# Patient Record
Sex: Male | Born: 1950
Health system: Southern US, Community
[De-identification: ages and names within clinical notes are randomized; demographics above are authoritative.]

## PROBLEM LIST (undated history)

## (undated) DIAGNOSIS — I10 Essential (primary) hypertension: Secondary | ICD-10-CM

## (undated) DIAGNOSIS — K219 Gastro-esophageal reflux disease without esophagitis: Secondary | ICD-10-CM

## (undated) HISTORY — DX: Gastro-esophageal reflux disease without esophagitis: K21.9

## (undated) HISTORY — DX: Essential (primary) hypertension: I10

## (undated) HISTORY — PX: COLONOSCOPY W/ POLYPECTOMY: SHX1380

---

## 1989-08-15 HISTORY — PX: VASECTOMY: SHX75

## 2007-06-28 ENCOUNTER — Ambulatory Visit: Payer: Self-pay | Admitting: Internal Medicine

## 2007-09-24 ENCOUNTER — Ambulatory Visit: Payer: Self-pay | Admitting: Gastroenterology

## 2007-11-14 ENCOUNTER — Ambulatory Visit: Payer: Self-pay | Admitting: Internal Medicine

## 2007-11-27 ENCOUNTER — Ambulatory Visit: Payer: Self-pay | Admitting: Internal Medicine

## 2007-12-14 ENCOUNTER — Ambulatory Visit: Payer: Self-pay | Admitting: Internal Medicine

## 2008-01-14 ENCOUNTER — Ambulatory Visit: Payer: Self-pay | Admitting: Internal Medicine

## 2008-01-29 ENCOUNTER — Ambulatory Visit: Payer: Self-pay | Admitting: Internal Medicine

## 2008-02-13 ENCOUNTER — Ambulatory Visit: Payer: Self-pay | Admitting: Internal Medicine

## 2010-05-09 ENCOUNTER — Emergency Department: Payer: Self-pay | Admitting: Emergency Medicine

## 2010-05-18 ENCOUNTER — Ambulatory Visit: Payer: Self-pay | Admitting: Internal Medicine

## 2010-09-20 ENCOUNTER — Ambulatory Visit: Payer: Self-pay | Admitting: Gastroenterology

## 2010-09-22 LAB — PATHOLOGY REPORT

## 2012-02-22 ENCOUNTER — Ambulatory Visit: Payer: Self-pay

## 2012-05-01 ENCOUNTER — Ambulatory Visit: Payer: Self-pay | Admitting: Gastroenterology

## 2012-10-01 ENCOUNTER — Ambulatory Visit: Payer: Self-pay | Admitting: Gastroenterology

## 2012-10-02 LAB — PATHOLOGY REPORT

## 2013-06-12 ENCOUNTER — Emergency Department: Payer: Self-pay | Admitting: Internal Medicine

## 2013-06-21 ENCOUNTER — Emergency Department: Payer: Self-pay | Admitting: Emergency Medicine

## 2016-04-29 ENCOUNTER — Ambulatory Visit
Admission: RE | Admit: 2016-04-29 | Discharge: 2016-04-29 | Disposition: A | Discharge status: Home / Self Care | Payer: 59 | Source: Ambulatory Visit | Attending: Internal Medicine | Admitting: Internal Medicine

## 2016-04-29 ENCOUNTER — Other Ambulatory Visit: Payer: Self-pay | Admitting: Internal Medicine

## 2016-04-29 DIAGNOSIS — R0602 Shortness of breath: Secondary | ICD-10-CM

## 2017-08-22 ENCOUNTER — Other Ambulatory Visit: Payer: Self-pay

## 2017-08-22 MED ORDER — PANTOPRAZOLE SODIUM 40 MG PO TBEC: 40.0000 mg | DELAYED_RELEASE_TABLET | Freq: Every day | ORAL | 1 refills | Status: DC

## 2017-11-06 ENCOUNTER — Other Ambulatory Visit: Payer: Self-pay | Admitting: Internal Medicine

## 2017-11-06 DIAGNOSIS — E782 Mixed hyperlipidemia: Secondary | ICD-10-CM | POA: Diagnosis not present

## 2017-11-06 DIAGNOSIS — I10 Essential (primary) hypertension: Secondary | ICD-10-CM | POA: Diagnosis not present

## 2017-11-06 DIAGNOSIS — Z125 Encounter for screening for malignant neoplasm of prostate: Secondary | ICD-10-CM | POA: Diagnosis not present

## 2017-11-06 DIAGNOSIS — Z0001 Encounter for general adult medical examination with abnormal findings: Secondary | ICD-10-CM | POA: Diagnosis not present

## 2017-11-07 LAB — COMPREHENSIVE METABOLIC PANEL
A/G RATIO: 1.8 (ref 1.2–2.2)
ALK PHOS: 55 IU/L (ref 39–117)
ALT: 24 IU/L (ref 0–44)
AST: 20 IU/L (ref 0–40)
Albumin: 4.4 g/dL (ref 3.6–4.8)
BILIRUBIN TOTAL: 0.6 mg/dL (ref 0.0–1.2)
BUN/Creatinine Ratio: 11 (ref 10–24)
BUN: 14 mg/dL (ref 8–27)
CALCIUM: 9.3 mg/dL (ref 8.6–10.2)
CHLORIDE: 102 mmol/L (ref 96–106)
CO2: 20 mmol/L (ref 20–29)
Creatinine, Ser: 1.3 mg/dL — ABNORMAL HIGH (ref 0.76–1.27)
GFR calc Af Amer: 66 mL/min/{1.73_m2} (ref >59)
GFR calc non Af Amer: 57 mL/min/{1.73_m2} — ABNORMAL LOW (ref >59)
Globulin, Total: 2.4 g/dL (ref 1.5–4.5)
Glucose: 96 mg/dL (ref 65–99)
POTASSIUM: 4.9 mmol/L (ref 3.5–5.2)
SODIUM: 142 mmol/L (ref 134–144)
Total Protein: 6.8 g/dL (ref 6.0–8.5)

## 2017-11-07 LAB — CBC
HEMOGLOBIN: 14.8 g/dL (ref 13.0–17.7)
Hematocrit: 43.9 % (ref 37.5–51.0)
MCH: 31.7 pg (ref 26.6–33.0)
MCHC: 33.7 g/dL (ref 31.5–35.7)
MCV: 94 fL (ref 79–97)
PLATELETS: 306 10*3/uL (ref 150–379)
RBC: 4.67 x10E6/uL (ref 4.14–5.80)
RDW: 13.7 % (ref 12.3–15.4)
WBC: 11.6 10*3/uL — AB (ref 3.4–10.8)

## 2017-11-07 LAB — LIPID PANEL W/O CHOL/HDL RATIO
CHOLESTEROL TOTAL: 223 mg/dL — AB (ref 100–199)
HDL: 49 mg/dL (ref >39)
LDL CALC: 155 mg/dL — AB (ref 0–99)
TRIGLYCERIDES: 97 mg/dL (ref 0–149)
VLDL CHOLESTEROL CAL: 19 mg/dL (ref 5–40)

## 2017-11-07 LAB — T4, FREE: Free T4: 1.2 ng/dL (ref 0.82–1.77)

## 2017-11-07 LAB — TSH: TSH: 2.18 u[IU]/mL (ref 0.450–4.500)

## 2017-11-07 LAB — PSA: PROSTATE SPECIFIC AG, SERUM: 0.5 ng/mL (ref 0.0–4.0)

## 2017-12-12 ENCOUNTER — Encounter: Payer: Self-pay | Admitting: Nurse Practitioner

## 2017-12-12 ENCOUNTER — Ambulatory Visit: Payer: Self-pay | Admitting: Nurse Practitioner

## 2017-12-12 ENCOUNTER — Ambulatory Visit: Payer: Medicare Other | Admitting: Nurse Practitioner

## 2017-12-12 VITALS — BP 134/76 | HR 64 | Resp 16 | Ht 68.0 in | Wt 171.6 lb

## 2017-12-12 DIAGNOSIS — I10 Essential (primary) hypertension: Secondary | ICD-10-CM

## 2017-12-12 DIAGNOSIS — K219 Gastro-esophageal reflux disease without esophagitis: Secondary | ICD-10-CM | POA: Diagnosis not present

## 2017-12-12 DIAGNOSIS — F17210 Nicotine dependence, cigarettes, uncomplicated: Secondary | ICD-10-CM

## 2017-12-12 DIAGNOSIS — D72829 Elevated white blood cell count, unspecified: Secondary | ICD-10-CM

## 2017-12-12 MED ORDER — AMLODIPINE BESYLATE 2.5 MG PO TABS: 2.5000 mg | ORAL_TABLET | Freq: Every day | ORAL | 5 refills | Status: DC

## 2017-12-12 MED ORDER — PANTOPRAZOLE SODIUM 40 MG PO TBEC
40.0000 mg | DELAYED_RELEASE_TABLET | Freq: Every day | ORAL | 5 refills | Status: DC
Start: 2017-12-12 — End: 2018-06-26

## 2017-12-12 NOTE — Progress Notes (Signed)
[facility] [place], [place] [address]  Internal MEDICINE  Office Visit Note  Patient Name: Adam Hurst  [id number]  [id number]  Date of Service: 01/03/2018   Pt is here for routine follow up.   Chief Complaint  Patient presents with  . Hypertension    Hypertension  This is a chronic problem. The current episode started more than 1 year ago. The problem is unchanged. The problem is controlled. Associated symptoms include malaise/fatigue and palpitations. Pertinent negatives include no chest pain, headaches, neck pain or shortness of breath. Risk factors for coronary artery disease include smoking/tobacco exposure and male gender. Past treatments include calcium channel blockers. The current treatment provides moderate improvement. There are no compliance problems.        Current Medication: Outpatient Encounter Medications as of 12/12/2017  Medication Sig  . aspirin EC 81 MG tablet Take 81 mg by mouth daily.  . pantoprazole (PROTONIX) 40 MG tablet Take 1 tablet (40 mg total) by mouth daily.  . [DISCONTINUED] pantoprazole (PROTONIX) 40 MG tablet Take 1 tablet (40 mg total) by mouth daily.  Marland Kitchen amLODipine (NORVASC) 2.5 MG tablet Take 1 tablet (2.5 mg total) by mouth daily.  . [DISCONTINUED] amLODipine (NORVASC) 2.5 MG tablet TAKE 1 TABLET BY MOUTH EVERY DAY FOR BLOOD PRESSURE   No facility-administered encounter medications on file as of 12/12/2017.     Surgical History: Past Surgical History:  Procedure Laterality Date  . VASECTOMY Bilateral 1991    Medical History: Past Medical History:  Diagnosis Date  . GERD (gastroesophageal reflux disease)   . Hypertension     Family History: Family History  Problem Relation Age of Onset  . Alzheimer's disease Mother   . Cancer Father     Social History   Socioeconomic History  . Marital status: Married    Spouse name: Not on file  . Number of children: Not on file  . Years of education: Not  on file  . Highest education level: Not on file  Occupational History  . Not on file  Social Needs  . Financial resource strain: Not on file  . Food insecurity:    Worry: Not on file    Inability: Not on file  . Transportation needs:    Medical: Not on file    Non-medical: Not on file  Tobacco Use  . Smoking status: Current Every Day Smoker    Packs/day: 1.00    Types: Cigarettes  . Smokeless tobacco: Never Used  Substance and Sexual Activity  . Alcohol use: Yes    Comment: rare  . Drug use: Never  . Sexual activity: Not on file  Lifestyle  . Physical activity:    Days per week: Not on file    Minutes per session: Not on file  . Stress: Not on file  Relationships  . Social connections:    Talks on phone: Not on file    Gets together: Not on file    Attends religious service: Not on file    Active member of club or organization: Not on file    Attends meetings of clubs or organizations: Not on file    Relationship status: Not on file  . Intimate partner violence:    Fear of current or ex partner: Not on file    Emotionally abused: Not on file    Physically abused: Not on file    Forced sexual activity: Not on file  Other Topics Concern  . Not on  file  Social History Narrative  . Not on file      Review of Systems  Constitutional: Positive for malaise/fatigue. Negative for activity change, chills, fatigue and unexpected weight change.  HENT: Negative for congestion, postnasal drip, rhinorrhea, sneezing, sore throat and voice change.   Eyes: Negative.  Negative for redness.  Respiratory: Negative for cough, chest tightness, shortness of breath and wheezing.   Cardiovascular: Positive for palpitations. Negative for chest pain.  Gastrointestinal: Negative for abdominal pain, constipation, diarrhea, nausea and vomiting.  Endocrine: Negative for cold intolerance, heat intolerance, polydipsia, polyphagia and polyuria.  Genitourinary: Negative for dysuria and frequency.   Musculoskeletal: Negative for arthralgias, back pain, joint swelling and neck pain.  Skin: Negative for rash.  Allergic/Immunologic: Positive for environmental allergies.  Neurological: Negative for dizziness, tremors, numbness and headaches.  Hematological: Negative for adenopathy. Does not bruise/bleed easily.  Psychiatric/Behavioral: Negative for behavioral problems (Depression), dysphoric mood, sleep disturbance and suicidal ideas. The patient is not nervous/anxious.     Today's Vitals   12/12/17 1413  BP: 134/76  Pulse: 64  Resp: 16  SpO2: 98%  Weight: 171 lb 9.6 oz (77.8 kg)  Height: 5\' 8"  (1.727 m)    Physical Exam  Constitutional: He is oriented to person, place, and time. He appears well-developed and well-nourished. No distress.  HENT:  Head: Normocephalic and atraumatic.  Nose: Nose normal.  Mouth/Throat: Oropharynx is clear and moist. No oropharyngeal exudate.  Eyes: Pupils are equal, round, and reactive to light. Conjunctivae and EOM are normal.  Neck: Normal range of motion. Neck supple. No JVD present. Carotid bruit is not present. No tracheal deviation present. No thyromegaly present.  Cardiovascular: Normal rate, regular rhythm and normal heart sounds. Exam reveals no gallop and no friction rub.  No murmur heard. Pulmonary/Chest: Effort normal and breath sounds normal. No respiratory distress. He has no wheezes. He has no rales. He exhibits no tenderness.  Abdominal: Soft. Bowel sounds are normal.  Musculoskeletal: Normal range of motion.  Lymphadenopathy:    He has no cervical adenopathy.  Neurological: He is alert and oriented to person, place, and time. No cranial nerve deficit.  Skin: Skin is warm and dry. Capillary refill takes 2 to 3 seconds. He is not diaphoretic.  Psychiatric: He has a normal mood and affect. His behavior is normal. Judgment and thought content normal.  Nursing note and vitals reviewed.  Assessment/Plan: 1. Essential  hypertension Well controlled. Continue norvasc as prescribed  - amLODipine (NORVASC) 2.5 MG tablet; Take 1 tablet (2.5 mg total) by mouth daily.  Dispense: 30 tablet; Refill: 5  2. Gastroesophageal reflux disease without esophagitis Continue pantoprazole as prescribed.  - pantoprazole (PROTONIX) 40 MG tablet; Take 1 tablet (40 mg total) by mouth daily.  Dispense: 30 tablet; Refill: 5  3. Leukocytosis, unspecified type Recheck CBC and treat as indicated.  - CBC  4. Cigarette smoker Encouraged smoking cessation.    General Counseling: Acelin verbalizes understanding of the findings of todays visit and agrees with plan of treatment. I have discussed any further diagnostic evaluation that may be needed or ordered today. We also reviewed his medications today. he has been encouraged to call the office with any questions or concerns that should arise related to todays visit.   Hypertension Counseling:   The following hypertensive lifestyle modification were recommended and discussed:  1. Limiting alcohol intake to less than 1 oz/day of ethanol:(24 oz of beer or 8 oz of wine or 2 oz of 100-proof whiskey). 2.  Take baby ASA 81 mg daily. 3. Importance of regular aerobic exercise and losing weight. 4. Reduce dietary saturated fat and cholesterol intake for overall cardiovascular health. 5. Maintaining adequate dietary potassium, calcium, and magnesium intake. 6. Regular monitoring of the blood pressure. 7. Reduce sodium intake to less than 100 mmol/day (less than 2.3 gm of sodium or less than 6 gm of sodium choride)   This patient was seen by Leretha Pol, FNP- C in Collaboration with Dr Lavera Guise as a part of collaborative care agreement    Orders Placed This Encounter  Procedures  . CBC    Meds ordered this encounter  Medications  . amLODipine (NORVASC) 2.5 MG tablet    Sig: Take 1 tablet (2.5 mg total) by mouth daily.    Dispense:  30 tablet    Refill:  5    Order Specific  Question:   Supervising Provider    Answer:   Lavera Guise [[id number]]  . pantoprazole (PROTONIX) 40 MG tablet    Sig: Take 1 tablet (40 mg total) by mouth daily.    Dispense:  30 tablet    Refill:  5    Order Specific Question:   Supervising Provider    Answer:   Lavera Guise [[id number]]    Time spent: 84 Minutes        Dr Lavera Guise Internal medicine

## 2018-01-03 ENCOUNTER — Encounter: Payer: Self-pay | Admitting: Nurse Practitioner

## 2018-01-03 DIAGNOSIS — F17210 Nicotine dependence, cigarettes, uncomplicated: Secondary | ICD-10-CM | POA: Insufficient documentation

## 2018-01-03 DIAGNOSIS — I10 Essential (primary) hypertension: Secondary | ICD-10-CM | POA: Insufficient documentation

## 2018-01-03 DIAGNOSIS — K219 Gastro-esophageal reflux disease without esophagitis: Secondary | ICD-10-CM | POA: Insufficient documentation

## 2018-01-03 DIAGNOSIS — D72829 Elevated white blood cell count, unspecified: Secondary | ICD-10-CM | POA: Insufficient documentation

## 2018-01-17 ENCOUNTER — Other Ambulatory Visit: Payer: Self-pay | Admitting: Nurse Practitioner

## 2018-01-17 DIAGNOSIS — Z125 Encounter for screening for malignant neoplasm of prostate: Secondary | ICD-10-CM | POA: Diagnosis not present

## 2018-01-17 DIAGNOSIS — E782 Mixed hyperlipidemia: Secondary | ICD-10-CM | POA: Diagnosis not present

## 2018-01-17 DIAGNOSIS — I10 Essential (primary) hypertension: Secondary | ICD-10-CM | POA: Diagnosis not present

## 2018-01-17 DIAGNOSIS — Z0001 Encounter for general adult medical examination with abnormal findings: Secondary | ICD-10-CM | POA: Diagnosis not present

## 2018-01-18 LAB — TSH: TSH: 2.67 u[IU]/mL (ref 0.450–4.500)

## 2018-01-18 LAB — LIPID PANEL W/O CHOL/HDL RATIO
Cholesterol, Total: 184 mg/dL (ref 100–199)
HDL: 44 mg/dL (ref >39)
LDL Calculated: 120 mg/dL — ABNORMAL HIGH (ref 0–99)
TRIGLYCERIDES: 102 mg/dL (ref 0–149)
VLDL Cholesterol Cal: 20 mg/dL (ref 5–40)

## 2018-01-18 LAB — CBC
HEMATOCRIT: 42.5 % (ref 37.5–51.0)
HEMOGLOBIN: 14.1 g/dL (ref 13.0–17.7)
MCH: 31.7 pg (ref 26.6–33.0)
MCHC: 33.2 g/dL (ref 31.5–35.7)
MCV: 96 fL (ref 79–97)
Platelets: 324 10*3/uL (ref 150–450)
RBC: 4.45 x10E6/uL (ref 4.14–5.80)
RDW: 13 % (ref 12.3–15.4)
WBC: 11.9 10*3/uL — ABNORMAL HIGH (ref 3.4–10.8)

## 2018-01-18 LAB — COMPREHENSIVE METABOLIC PANEL
ALT: 13 IU/L (ref 0–44)
AST: 14 IU/L (ref 0–40)
Albumin/Globulin Ratio: 2 (ref 1.2–2.2)
Albumin: 4.1 g/dL (ref 3.6–4.8)
Alkaline Phosphatase: 59 IU/L (ref 39–117)
BILIRUBIN TOTAL: 1.1 mg/dL (ref 0.0–1.2)
BUN / CREAT RATIO: 9 — AB (ref 10–24)
BUN: 12 mg/dL (ref 8–27)
CHLORIDE: 103 mmol/L (ref 96–106)
CO2: 23 mmol/L (ref 20–29)
CREATININE: 1.34 mg/dL — AB (ref 0.76–1.27)
Calcium: 8.9 mg/dL (ref 8.6–10.2)
GFR calc Af Amer: 64 mL/min/{1.73_m2} (ref >59)
GFR calc non Af Amer: 55 mL/min/{1.73_m2} — ABNORMAL LOW (ref >59)
GLUCOSE: 72 mg/dL (ref 65–99)
Globulin, Total: 2.1 g/dL (ref 1.5–4.5)
Potassium: 4.5 mmol/L (ref 3.5–5.2)
Sodium: 140 mmol/L (ref 134–144)
Total Protein: 6.2 g/dL (ref 6.0–8.5)

## 2018-01-18 LAB — PSA: Prostate Specific Ag, Serum: 0.6 ng/mL (ref 0.0–4.0)

## 2018-05-30 DIAGNOSIS — Z23 Encounter for immunization: Secondary | ICD-10-CM | POA: Diagnosis not present

## 2018-06-26 ENCOUNTER — Encounter: Payer: Self-pay | Admitting: Nurse Practitioner

## 2018-06-26 ENCOUNTER — Ambulatory Visit (INDEPENDENT_AMBULATORY_CARE_PROVIDER_SITE_OTHER): Payer: Medicare Other | Admitting: Nurse Practitioner

## 2018-06-26 VITALS — BP 151/89 | HR 62 | Resp 16 | Ht 68.0 in | Wt 173.8 lb

## 2018-06-26 DIAGNOSIS — K219 Gastro-esophageal reflux disease without esophagitis: Secondary | ICD-10-CM

## 2018-06-26 DIAGNOSIS — Z1211 Encounter for screening for malignant neoplasm of colon: Secondary | ICD-10-CM | POA: Insufficient documentation

## 2018-06-26 DIAGNOSIS — N289 Disorder of kidney and ureter, unspecified: Secondary | ICD-10-CM | POA: Diagnosis not present

## 2018-06-26 DIAGNOSIS — D485 Neoplasm of uncertain behavior of skin: Secondary | ICD-10-CM | POA: Insufficient documentation

## 2018-06-26 DIAGNOSIS — Z0001 Encounter for general adult medical examination with abnormal findings: Secondary | ICD-10-CM | POA: Diagnosis not present

## 2018-06-26 DIAGNOSIS — Z23 Encounter for immunization: Secondary | ICD-10-CM

## 2018-06-26 DIAGNOSIS — I10 Essential (primary) hypertension: Secondary | ICD-10-CM

## 2018-06-26 DIAGNOSIS — D72829 Elevated white blood cell count, unspecified: Secondary | ICD-10-CM

## 2018-06-26 DIAGNOSIS — F17210 Nicotine dependence, cigarettes, uncomplicated: Secondary | ICD-10-CM | POA: Diagnosis not present

## 2018-06-26 MED ORDER — PANTOPRAZOLE SODIUM 40 MG PO TBEC: 40.0000 mg | DELAYED_RELEASE_TABLET | Freq: Every day | ORAL | 5 refills | Status: DC

## 2018-06-26 MED ORDER — AMLODIPINE BESYLATE 2.5 MG PO TABS: 2.5000 mg | ORAL_TABLET | Freq: Every day | ORAL | 5 refills | Status: DC

## 2018-06-26 NOTE — Progress Notes (Signed)
[facility] [place], [place] [address]  Internal MEDICINE  Office Visit Note  Patient Name: Adam Hurst  [id number]  [id number]  Date of Service: 06/26/2018  Chief Complaint  Patient presents with  . Medicare Wellness    54month cpe  . Gastroesophageal Reflux  . Hypertension     The patient is here for routine health maintenance exam. He has a spot on his upper right leg which has not gone away in some time. Has not mentioned it before. He thought it was related to wear his pants rubbed while he has at work. Has not improved since his retirement in January.  He also has a similar spot on the upper left ear lobe. States that every time he gets his hair cut, he is told he should have that looked at as well.  Blood pressure is slightly elevated this morning. Not generally high. Takes it at [facility] and will run around 120/80  Pt is here for routine health maintenance examination  Current Medication: Outpatient Encounter Medications as of 06/26/2018  Medication Sig  . amLODipine (NORVASC) 2.5 MG tablet Take 1 tablet (2.5 mg total) by mouth daily.  Marland Kitchen aspirin EC 81 MG tablet Take 81 mg by mouth daily.  . pantoprazole (PROTONIX) 40 MG tablet Take 1 tablet (40 mg total) by mouth daily.  . [DISCONTINUED] amLODipine (NORVASC) 2.5 MG tablet Take 1 tablet (2.5 mg total) by mouth daily.  . [DISCONTINUED] pantoprazole (PROTONIX) 40 MG tablet Take 1 tablet (40 mg total) by mouth daily.   No facility-administered encounter medications on file as of 06/26/2018.     Surgical History: Past Surgical History:  Procedure Laterality Date  . VASECTOMY Bilateral 1991    Medical History: Past Medical History:  Diagnosis Date  . GERD (gastroesophageal reflux disease)   . Hypertension     Family History: Family History  Problem Relation Age of Onset  . Alzheimer's disease Mother   . Cancer Father       Review of Systems  Constitutional: Negative for chills,  fatigue and unexpected weight change.  HENT: Negative for congestion, postnasal drip, rhinorrhea, sneezing and sore throat.   Eyes: Negative.  Negative for redness.  Respiratory: Negative for cough, chest tightness, shortness of breath and wheezing.   Cardiovascular: Negative for chest pain and palpitations.       Blood pressure elevated today  Gastrointestinal: Negative for abdominal pain, constipation, diarrhea, nausea and vomiting.       Well managed GERD.   Endocrine: Negative for cold intolerance, heat intolerance, polydipsia, polyphagia and polyuria.  Genitourinary: Negative.  Negative for dysuria and frequency.  Musculoskeletal: Negative for arthralgias, back pain, joint swelling and neck pain.  Skin: Negative for rash.       Skin lesion on the right upper leg. Has been present for some time .Has not improved after retirement. Is flanky and dry and has not changed.   Allergic/Immunologic: Negative for environmental allergies.  Neurological: Negative for dizziness, tremors, numbness and headaches.  Hematological: Negative for adenopathy. Does not bruise/bleed easily.  Psychiatric/Behavioral: Negative for behavioral problems (Depression), sleep disturbance and suicidal ideas. The patient is not nervous/anxious.      Vital Signs: BP (!) 151/89 (BP Location: Right Arm, Patient Position: Sitting, Cuff Size: Large)   Pulse 62   Resp 16   Ht 5\' 8"  (1.727 m)   Wt 173 lb 12.8 oz (78.8 kg)   SpO2 98%   BMI 26.43 kg/m  Physical Exam  Constitutional: He is oriented to person, place, and time. He appears well-developed and well-nourished. No distress.  HENT:  Head: Normocephalic and atraumatic.  Nose: Nose normal.  Mouth/Throat: Oropharynx is clear and moist. No oropharyngeal exudate.  Eyes: Pupils are equal, round, and reactive to light. EOM are normal.  Neck: Normal range of motion. Neck supple. No JVD present. Carotid bruit is not present. No tracheal deviation present. No  thyromegaly present.  Cardiovascular: Normal rate, regular rhythm, normal heart sounds and intact distal pulses. Exam reveals no gallop and no friction rub.  No murmur heard. Pulmonary/Chest: Effort normal and breath sounds normal. No respiratory distress. He has no wheezes. He has no rales. He exhibits no tenderness.  Abdominal: Soft. Bowel sounds are normal. There is no tenderness.  Musculoskeletal: Normal range of motion.  Lymphadenopathy:    He has no cervical adenopathy.  Neurological: He is alert and oriented to person, place, and time. No cranial nerve deficit.  Skin: Skin is warm and dry. He is not diaphoretic.     Psychiatric: He has a normal mood and affect. His behavior is normal. Judgment and thought content normal.  Nursing note and vitals reviewed.  Depression screen [facility] 2/9 06/26/2018 12/12/2017  Decreased Interest 0 0  Down, Depressed, Hopeless 0 0  PHQ - 2 Score 0 0    Functional Status Survey: Is the patient deaf or have difficulty hearing?: No(pt has hearing aides ) Does the patient have difficulty seeing, even when wearing glasses/contacts?: No Does the patient have difficulty concentrating, remembering, or making decisions?: No Does the patient have difficulty walking or climbing stairs?: No Does the patient have difficulty dressing or bathing?: No Does the patient have difficulty doing errands alone such as visiting a doctor's office or shopping?: No  MMSE - [place] Exam 06/26/2018  Orientation to time 5  Orientation to Place 5  Registration 3  Attention/ Calculation 5  Recall 3  Language- name 2 objects 2  Language- repeat 1  Language- follow 3 step command 3  Language- read & follow direction 1  Write a sentence 1  Copy design 1  Total score 30    Fall Risk  06/26/2018 12/12/2017  Falls in the past year? 0 No   Assessment/Plan: 1. Encounter for general adult medical examination with abnormal findings Annual health maintenance exam  today  2. Essential hypertension Slightly elevated today, but generally well controlled. Continue amlodipine as prescribed . Will have him monitor closely. Discussed DASH diet and increased exercise to help control blood pressure.  - amLODipine (NORVASC) 2.5 MG tablet; Take 1 tablet (2.5 mg total) by mouth daily.  Dispense: 30 tablet; Refill: 5  3. Gastroesophageal reflux disease without esophagitis - pantoprazole (PROTONIX) 40 MG tablet; Take 1 tablet (40 mg total) by mouth daily.  Dispense: 30 tablet; Refill: 5  4. Neoplasm of uncertain behavior of skin of lower extremity Refer to dermatology for further evaluation and treatment.  - Ambulatory referral to Dermatology  5. Leukocytosis, unspecified type Recheck CBC  6. Abnormal kidney function Recheck BMP  7. Need for vaccination against Streptococcus pneumoniae using pneumococcal conjugate vaccine 13 rx for prevnar 13 sent to his pharmacy for administration.  - Pneumococcal conjugate vaccine 13-valent IM  8. Cigarette smoker Discussion about smoking cessation. Benefits to blood pressure and lung function as nonsmoker .   General Counseling: Brayden verbalizes understanding of the findings of todays visit and agrees with plan of treatment. I have discussed any further  diagnostic evaluation that may be needed or ordered today. We also reviewed his medications today. he has been encouraged to call the office with any questions or concerns that should arise related to todays visit.    Counseling:  Hypertension Counseling:   The following hypertensive lifestyle modification were recommended and discussed:  1. Limiting alcohol intake to less than 1 oz/day of ethanol:(24 oz of beer or 8 oz of wine or 2 oz of 100-proof whiskey). 2. Take baby ASA 81 mg daily. 3. Importance of regular aerobic exercise and losing weight. 4. Reduce dietary saturated fat and cholesterol intake for overall cardiovascular health. 5. Maintaining adequate  dietary potassium, calcium, and magnesium intake. 6. Regular monitoring of the blood pressure. 7. Reduce sodium intake to less than 100 mmol/day (less than 2.3 gm of sodium or less than 6 gm of sodium choride)   Smoking cessation counseling: 1. Pt acknowledges the risks of long term smoking, she will try to quite smoking. 2. Options for different medications including nicotine products, chewing gum, patch etc, Wellbutrin and Chantix is discussed 3. Goal and date of compete cessation is discussed 4. Total time spent in smoking cessation is 15 min.   Orders Placed This Encounter  Procedures  . Pneumococcal conjugate vaccine 13-valent IM  . Ambulatory referral to Dermatology    Meds ordered this encounter  Medications  . amLODipine (NORVASC) 2.5 MG tablet    Sig: Take 1 tablet (2.5 mg total) by mouth daily.    Dispense:  30 tablet    Refill:  5    Order Specific Question:   Supervising Provider    Answer:   Lavera Guise [[id number]]  . pantoprazole (PROTONIX) 40 MG tablet    Sig: Take 1 tablet (40 mg total) by mouth daily.    Dispense:  30 tablet    Refill:  5    Order Specific Question:   Supervising Provider    Answer:   Lavera Guise [[id number]]    Time spent: [place], MD  Internal Medicine

## 2018-07-18 DIAGNOSIS — D2261 Melanocytic nevi of right upper limb, including shoulder: Secondary | ICD-10-CM | POA: Diagnosis not present

## 2018-07-18 DIAGNOSIS — L821 Other seborrheic keratosis: Secondary | ICD-10-CM | POA: Diagnosis not present

## 2018-07-18 DIAGNOSIS — C4441 Basal cell carcinoma of skin of scalp and neck: Secondary | ICD-10-CM | POA: Diagnosis not present

## 2018-07-18 DIAGNOSIS — C44712 Basal cell carcinoma of skin of right lower limb, including hip: Secondary | ICD-10-CM | POA: Diagnosis not present

## 2018-07-18 DIAGNOSIS — D485 Neoplasm of uncertain behavior of skin: Secondary | ICD-10-CM | POA: Diagnosis not present

## 2018-07-18 DIAGNOSIS — L570 Actinic keratosis: Secondary | ICD-10-CM | POA: Diagnosis not present

## 2018-07-18 DIAGNOSIS — D225 Melanocytic nevi of trunk: Secondary | ICD-10-CM | POA: Diagnosis not present

## 2018-07-18 DIAGNOSIS — D2272 Melanocytic nevi of left lower limb, including hip: Secondary | ICD-10-CM | POA: Diagnosis not present

## 2018-07-18 DIAGNOSIS — D2262 Melanocytic nevi of left upper limb, including shoulder: Secondary | ICD-10-CM | POA: Diagnosis not present

## 2018-07-18 DIAGNOSIS — D2271 Melanocytic nevi of right lower limb, including hip: Secondary | ICD-10-CM | POA: Diagnosis not present

## 2018-07-18 DIAGNOSIS — X32XXXA Exposure to sunlight, initial encounter: Secondary | ICD-10-CM | POA: Diagnosis not present

## 2018-07-18 DIAGNOSIS — L218 Other seborrheic dermatitis: Secondary | ICD-10-CM | POA: Diagnosis not present

## 2018-09-06 DIAGNOSIS — C44712 Basal cell carcinoma of skin of right lower limb, including hip: Secondary | ICD-10-CM | POA: Diagnosis not present

## 2018-09-06 DIAGNOSIS — C4491 Basal cell carcinoma of skin, unspecified: Secondary | ICD-10-CM | POA: Diagnosis not present

## 2018-10-08 ENCOUNTER — Other Ambulatory Visit: Payer: Self-pay | Admitting: Nurse Practitioner

## 2018-10-08 DIAGNOSIS — D72829 Elevated white blood cell count, unspecified: Secondary | ICD-10-CM | POA: Diagnosis not present

## 2018-10-08 DIAGNOSIS — N289 Disorder of kidney and ureter, unspecified: Secondary | ICD-10-CM | POA: Diagnosis not present

## 2018-10-09 LAB — CBC
HEMATOCRIT: 43 % (ref 37.5–51.0)
HEMOGLOBIN: 14.7 g/dL (ref 13.0–17.7)
MCH: 31.9 pg (ref 26.6–33.0)
MCHC: 34.2 g/dL (ref 31.5–35.7)
MCV: 93 fL (ref 79–97)
Platelets: 301 10*3/uL (ref 150–450)
RBC: 4.61 x10E6/uL (ref 4.14–5.80)
RDW: 11.8 % (ref 11.6–15.4)
WBC: 12.3 10*3/uL — ABNORMAL HIGH (ref 3.4–10.8)

## 2018-10-10 ENCOUNTER — Telehealth: Payer: Self-pay

## 2018-10-10 NOTE — Progress Notes (Signed)
PT WAS NOTIFIED. 

## 2018-10-10 NOTE — Progress Notes (Signed)
lmom to call back 

## 2018-10-10 NOTE — Telephone Encounter (Signed)
PT CALLED BACK AND I NOTIFIED HIM THAT HIS LABS ARE STABLE AND IMPROVED.

## 2018-12-24 ENCOUNTER — Other Ambulatory Visit: Payer: Self-pay

## 2018-12-24 DIAGNOSIS — K219 Gastro-esophageal reflux disease without esophagitis: Secondary | ICD-10-CM

## 2018-12-24 MED ORDER — PANTOPRAZOLE SODIUM 40 MG PO TBEC: 40.0000 mg | DELAYED_RELEASE_TABLET | Freq: Every day | ORAL | 2 refills | Status: DC

## 2018-12-27 ENCOUNTER — Encounter: Payer: Self-pay | Admitting: Nurse Practitioner

## 2018-12-27 ENCOUNTER — Telehealth: Payer: Self-pay

## 2018-12-27 ENCOUNTER — Ambulatory Visit (INDEPENDENT_AMBULATORY_CARE_PROVIDER_SITE_OTHER): Payer: Medicare Other | Admitting: Nurse Practitioner

## 2018-12-27 ENCOUNTER — Other Ambulatory Visit: Payer: Self-pay

## 2018-12-27 VITALS — BP 144/84 | HR 65 | Resp 16 | Ht 68.0 in | Wt 171.4 lb

## 2018-12-27 DIAGNOSIS — K219 Gastro-esophageal reflux disease without esophagitis: Secondary | ICD-10-CM

## 2018-12-27 DIAGNOSIS — Z1211 Encounter for screening for malignant neoplasm of colon: Secondary | ICD-10-CM | POA: Diagnosis not present

## 2018-12-27 DIAGNOSIS — I10 Essential (primary) hypertension: Secondary | ICD-10-CM | POA: Diagnosis not present

## 2018-12-27 MED ORDER — PANTOPRAZOLE SODIUM 40 MG PO TBEC: 40.0000 mg | DELAYED_RELEASE_TABLET | Freq: Every day | ORAL | 3 refills | Status: DC

## 2018-12-27 NOTE — Telephone Encounter (Signed)
Faxed cologuard 

## 2018-12-27 NOTE — Progress Notes (Signed)
[facility] [place], [place] [address]  Internal MEDICINE  Office Visit Note  Patient Name: Adam Hurst  [id number]  [id number]  Date of Service: 12/27/2018  Chief Complaint  Patient presents with  . Medical Management of Chronic Issues    6 month follow up  . Hypertension  . Gastroesophageal Reflux    The patient is here for routine follow up visit. Today, he states that he has had a few issues of GERD. Worse if he goes several hours without eating. If he eats less food, but more often, this does not happen. Blood pressure is well managed. He is due to have routine, fasting blood work done as well as screening for colon cancer.   Hypertension  This is a chronic problem. The current episode started more than 1 year ago. The problem is unchanged. The problem is controlled. Associated symptoms include malaise/fatigue. Pertinent negatives include no chest pain, headaches, neck pain, palpitations or shortness of breath. Risk factors for coronary artery disease include smoking/tobacco exposure and male gender. Past treatments include calcium channel blockers. The current treatment provides moderate improvement. There are no compliance problems.        Current Medication: Outpatient Encounter Medications as of 12/27/2018  Medication Sig  . amLODipine (NORVASC) 2.5 MG tablet Take 1 tablet (2.5 mg total) by mouth daily.  Marland Kitchen aspirin EC 81 MG tablet Take 81 mg by mouth daily.  . pantoprazole (PROTONIX) 40 MG tablet Take 1 tablet (40 mg total) by mouth daily.  . [DISCONTINUED] pantoprazole (PROTONIX) 40 MG tablet Take 1 tablet (40 mg total) by mouth daily.   No facility-administered encounter medications on file as of 12/27/2018.     Surgical History: Past Surgical History:  Procedure Laterality Date  . VASECTOMY Bilateral 1991    Medical History: Past Medical History:  Diagnosis Date  . GERD (gastroesophageal reflux disease)   . Hypertension      Family History: Family History  Problem Relation Age of Onset  . Alzheimer's disease Mother   . Cancer Father     Social History   Socioeconomic History  . Marital status: Married    Spouse name: Not on file  . Number of children: Not on file  . Years of education: Not on file  . Highest education level: Not on file  Occupational History  . Not on file  Social Needs  . Financial resource strain: Not on file  . Food insecurity:    Worry: Not on file    Inability: Not on file  . Transportation needs:    Medical: Not on file    Non-medical: Not on file  Tobacco Use  . Smoking status: Current Every Day Smoker    Packs/day: 1.00    Types: Cigarettes  . Smokeless tobacco: Never Used  Substance and Sexual Activity  . Alcohol use: Yes    Comment: rare  . Drug use: Never  . Sexual activity: Not on file  Lifestyle  . Physical activity:    Days per week: Not on file    Minutes per session: Not on file  . Stress: Not on file  Relationships  . Social connections:    Talks on phone: Not on file    Gets together: Not on file    Attends religious service: Not on file    Active member of club or organization: Not on file    Attends meetings of clubs or organizations: Not on file    Relationship  status: Not on file  . Intimate partner violence:    Fear of current or ex partner: Not on file    Emotionally abused: Not on file    Physically abused: Not on file    Forced sexual activity: Not on file  Other Topics Concern  . Not on file  Social History Narrative  . Not on file      Review of Systems  Constitutional: Positive for malaise/fatigue. Negative for activity change, chills, fatigue and unexpected weight change.  HENT: Negative for congestion, postnasal drip, rhinorrhea, sneezing, sore throat and voice change.   Respiratory: Negative for cough, chest tightness, shortness of breath and wheezing.   Cardiovascular: Negative for chest pain and palpitations.   Gastrointestinal: Negative for abdominal pain, constipation, diarrhea, nausea and vomiting.       Few episodes of increased GERD  Endocrine: Negative for cold intolerance, heat intolerance, polydipsia and polyuria.  Musculoskeletal: Negative for arthralgias, back pain, joint swelling and neck pain.  Skin: Negative for rash.  Allergic/Immunologic: Positive for environmental allergies.  Neurological: Negative for dizziness, tremors, numbness and headaches.  Hematological: Negative for adenopathy. Does not bruise/bleed easily.  Psychiatric/Behavioral: Negative for behavioral problems (Depression), dysphoric mood, sleep disturbance and suicidal ideas. The patient is not nervous/anxious.     Today's Vitals   12/27/18 0847  BP: (!) 144/84  Pulse: 65  Resp: 16  SpO2: 99%  Weight: 171 lb 6.4 oz (77.7 kg)  Height: 5\' 8"  (1.727 m)   Body mass index is 26.06 kg/m.   Physical Exam Vitals signs and nursing note reviewed.  Constitutional:      General: He is not in acute distress.    Appearance: Normal appearance. He is well-developed. He is not diaphoretic.  HENT:     Head: Normocephalic and atraumatic.     Mouth/Throat:     Pharynx: No oropharyngeal exudate.  Eyes:     Conjunctiva/sclera: Conjunctivae normal.     Pupils: Pupils are equal, round, and reactive to light.  Neck:     Musculoskeletal: Normal range of motion and neck supple.     Thyroid: No thyromegaly.     Vascular: No carotid bruit or JVD.     Trachea: No tracheal deviation.  Cardiovascular:     Rate and Rhythm: Normal rate and regular rhythm.     Heart sounds: Normal heart sounds. No murmur. No friction rub. No gallop.   Pulmonary:     Effort: Pulmonary effort is normal. No respiratory distress.     Breath sounds: Normal breath sounds. No wheezing or rales.  Chest:     Chest wall: No tenderness.  Abdominal:     General: Bowel sounds are normal.     Palpations: Abdomen is soft.  Musculoskeletal: Normal range of  motion.  Lymphadenopathy:     Cervical: No cervical adenopathy.  Skin:    General: Skin is warm and dry.     Capillary Refill: Capillary refill takes 2 to 3 seconds.  Neurological:     Mental Status: He is alert and oriented to person, place, and time.     Cranial Nerves: No cranial nerve deficit.  Psychiatric:        Behavior: Behavior normal.        Thought Content: Thought content normal.        Judgment: Judgment normal.    Assessment/Plan: 1. Essential hypertension Doing well. Continue bp medication as prescribed   2. Gastroesophageal reflux disease without esophagitis Renew pantoprazole and take as  prescribed. Continue to eat several, small meals each day. acoid triggers if possible. - pantoprazole (PROTONIX) 40 MG tablet; Take 1 tablet (40 mg total) by mouth daily.  Dispense: 90 tablet; Refill: 3  3. Screening for colon cancer Order for ColoGuard has been sent today.   General Counseling: Damareon verbalizes understanding of the findings of todays visit and agrees with plan of treatment. I have discussed any further diagnostic evaluation that may be needed or ordered today. We also reviewed his medications today. he has been encouraged to call the office with any questions or concerns that should arise related to todays visit.  This patient was seen by [place] with Dr Lavera Guise as a part of collaborative care agreement  Meds ordered this encounter  Medications  . pantoprazole (PROTONIX) 40 MG tablet    Sig: Take 1 tablet (40 mg total) by mouth daily.    Dispense:  90 tablet    Refill:  3    Order Specific Question:   Supervising Provider    Answer:   Lavera Guise [[id number]]    Time spent: 59 Minutes      Dr Lavera Guise Internal medicine

## 2018-12-27 NOTE — Progress Notes (Signed)
Pt blood pressure elevated informed provider.

## 2019-01-04 DIAGNOSIS — Z1212 Encounter for screening for malignant neoplasm of rectum: Secondary | ICD-10-CM | POA: Diagnosis not present

## 2019-01-04 DIAGNOSIS — Z1211 Encounter for screening for malignant neoplasm of colon: Secondary | ICD-10-CM | POA: Diagnosis not present

## 2019-01-14 ENCOUNTER — Telehealth: Payer: Self-pay | Admitting: Nurse Practitioner

## 2019-01-14 NOTE — Telephone Encounter (Signed)
Patient has been advised his cologuard results are negative. Beth

## 2019-01-23 ENCOUNTER — Encounter: Payer: Self-pay | Admitting: Nurse Practitioner

## 2019-01-23 LAB — COLOGUARD: Cologuard: NEGATIVE

## 2019-03-13 ENCOUNTER — Other Ambulatory Visit: Payer: Self-pay

## 2019-03-13 DIAGNOSIS — I10 Essential (primary) hypertension: Secondary | ICD-10-CM

## 2019-03-13 MED ORDER — AMLODIPINE BESYLATE 2.5 MG PO TABS: 2.5000 mg | ORAL_TABLET | Freq: Every day | ORAL | 5 refills | Status: DC

## 2019-05-06 ENCOUNTER — Other Ambulatory Visit: Payer: Self-pay | Admitting: Nurse Practitioner

## 2019-05-06 DIAGNOSIS — E782 Mixed hyperlipidemia: Secondary | ICD-10-CM | POA: Diagnosis not present

## 2019-05-06 DIAGNOSIS — I10 Essential (primary) hypertension: Secondary | ICD-10-CM | POA: Diagnosis not present

## 2019-05-06 DIAGNOSIS — Z0001 Encounter for general adult medical examination with abnormal findings: Secondary | ICD-10-CM | POA: Diagnosis not present

## 2019-05-06 DIAGNOSIS — Z125 Encounter for screening for malignant neoplasm of prostate: Secondary | ICD-10-CM | POA: Diagnosis not present

## 2019-05-07 LAB — COMPREHENSIVE METABOLIC PANEL
ALT: 13 IU/L (ref 0–44)
AST: 12 IU/L (ref 0–40)
Albumin/Globulin Ratio: 1.7 (ref 1.2–2.2)
Albumin: 4.2 g/dL (ref 3.8–4.8)
Alkaline Phosphatase: 67 IU/L (ref 39–117)
BUN/Creatinine Ratio: 12 (ref 10–24)
BUN: 18 mg/dL (ref 8–27)
Bilirubin Total: 1.2 mg/dL (ref 0.0–1.2)
CO2: 23 mmol/L (ref 20–29)
Calcium: 9.4 mg/dL (ref 8.6–10.2)
Chloride: 103 mmol/L (ref 96–106)
Creatinine, Ser: 1.49 mg/dL — ABNORMAL HIGH (ref 0.76–1.27)
GFR calc Af Amer: 56 mL/min/{1.73_m2} — ABNORMAL LOW (ref >59)
GFR calc non Af Amer: 48 mL/min/{1.73_m2} — ABNORMAL LOW (ref >59)
Globulin, Total: 2.5 g/dL (ref 1.5–4.5)
Glucose: 92 mg/dL (ref 65–99)
Potassium: 4.8 mmol/L (ref 3.5–5.2)
Sodium: 140 mmol/L (ref 134–144)
Total Protein: 6.7 g/dL (ref 6.0–8.5)

## 2019-05-07 LAB — LIPID PANEL W/O CHOL/HDL RATIO
Cholesterol, Total: 200 mg/dL — ABNORMAL HIGH (ref 100–199)
HDL: 44 mg/dL (ref >39)
LDL Chol Calc (NIH): 133 mg/dL — ABNORMAL HIGH (ref 0–99)
Triglycerides: 128 mg/dL (ref 0–149)
VLDL Cholesterol Cal: 23 mg/dL (ref 5–40)

## 2019-05-07 LAB — CBC
Hematocrit: 48.2 % (ref 37.5–51.0)
Hemoglobin: 15.4 g/dL (ref 13.0–17.7)
MCH: 31.2 pg (ref 26.6–33.0)
MCHC: 32 g/dL (ref 31.5–35.7)
MCV: 98 fL — ABNORMAL HIGH (ref 79–97)
Platelets: 300 10*3/uL (ref 150–450)
RBC: 4.94 x10E6/uL (ref 4.14–5.80)
RDW: 12 % (ref 11.6–15.4)
WBC: 12.7 10*3/uL — ABNORMAL HIGH (ref 3.4–10.8)

## 2019-05-07 LAB — T4, FREE: Free T4: 1.3 ng/dL (ref 0.82–1.77)

## 2019-05-07 LAB — TSH: TSH: 2.23 u[IU]/mL (ref 0.450–4.500)

## 2019-05-07 LAB — PSA: Prostate Specific Ag, Serum: 0.6 ng/mL (ref 0.0–4.0)

## 2019-06-03 DIAGNOSIS — Z23 Encounter for immunization: Secondary | ICD-10-CM | POA: Diagnosis not present

## 2019-06-16 NOTE — Progress Notes (Signed)
Worsening renal functions. Discuss with patient at visit 07/04/2019

## 2019-06-27 ENCOUNTER — Telehealth: Payer: Self-pay

## 2019-06-27 NOTE — Telephone Encounter (Signed)
CONFIRMED AND SCREENED PATIENT FOR 11-16 APPOINTMENT.

## 2019-07-01 ENCOUNTER — Other Ambulatory Visit: Payer: Self-pay

## 2019-07-01 ENCOUNTER — Ambulatory Visit (INDEPENDENT_AMBULATORY_CARE_PROVIDER_SITE_OTHER): Payer: Medicare Other | Admitting: Nurse Practitioner

## 2019-07-01 ENCOUNTER — Encounter: Payer: Self-pay | Admitting: Nurse Practitioner

## 2019-07-01 ENCOUNTER — Telehealth: Payer: Self-pay

## 2019-07-01 ENCOUNTER — Ambulatory Visit
Admission: RE | Admit: 2019-07-01 | Discharge: 2019-07-01 | Disposition: A | Discharge status: Home / Self Care | Payer: Medicare Other | Source: Ambulatory Visit | Attending: Nurse Practitioner | Admitting: Nurse Practitioner

## 2019-07-01 VITALS — BP 148/80 | HR 63 | Resp 16 | Ht 68.0 in | Wt 173.0 lb

## 2019-07-01 DIAGNOSIS — F17210 Nicotine dependence, cigarettes, uncomplicated: Secondary | ICD-10-CM | POA: Diagnosis not present

## 2019-07-01 DIAGNOSIS — Z720 Tobacco use: Secondary | ICD-10-CM | POA: Diagnosis not present

## 2019-07-01 DIAGNOSIS — Z23 Encounter for immunization: Secondary | ICD-10-CM

## 2019-07-01 DIAGNOSIS — Z0001 Encounter for general adult medical examination with abnormal findings: Secondary | ICD-10-CM | POA: Diagnosis not present

## 2019-07-01 DIAGNOSIS — N289 Disorder of kidney and ureter, unspecified: Secondary | ICD-10-CM

## 2019-07-01 DIAGNOSIS — D72829 Elevated white blood cell count, unspecified: Secondary | ICD-10-CM | POA: Diagnosis not present

## 2019-07-01 DIAGNOSIS — Z1159 Encounter for screening for other viral diseases: Secondary | ICD-10-CM | POA: Diagnosis not present

## 2019-07-01 DIAGNOSIS — I10 Essential (primary) hypertension: Secondary | ICD-10-CM

## 2019-07-01 DIAGNOSIS — J4520 Mild intermittent asthma, uncomplicated: Secondary | ICD-10-CM | POA: Diagnosis not present

## 2019-07-01 MED ORDER — AMOXICILLIN 875 MG PO TABS: 875.0000 mg | ORAL_TABLET | Freq: Two times a day (BID) | ORAL | 0 refills | Status: DC

## 2019-07-01 MED ORDER — PNEUMOCOCCAL 13-VAL CONJ VACC IM SUSP: 0.5000 mL | Freq: Once | INTRAMUSCULAR | 0 refills | Status: AC

## 2019-07-01 MED ORDER — BUDESONIDE-FORMOTEROL FUMARATE 80-4.5 MCG/ACT IN AERO: 2.0000 | INHALATION_SPRAY | Freq: Two times a day (BID) | RESPIRATORY_TRACT | 12 refills | Status: DC

## 2019-07-01 NOTE — Progress Notes (Signed)
Please let the patient know that chest x-ray appears normal. Thanks.

## 2019-07-01 NOTE — Addendum Note (Signed)
Addended by: Corlis Hove on: 07/01/2019 04:53 PM   Modules accepted: Orders

## 2019-07-01 NOTE — Telephone Encounter (Signed)
lmom to call us back

## 2019-07-01 NOTE — Progress Notes (Signed)
[facility] [place], [place] [address]  Internal MEDICINE  Office Visit Note  Patient Name: Adam Hurst  [id number]  [id number]  Date of Service: 07/01/2019   Pt is here for routine health maintenance examination  Chief Complaint  Patient presents with  . Annual Exam  . Hypertension     The patient is here for health maintenance exam. Recently had routine, fasting labs done. Labs work showing mild elevation of WBC and mildly elevated renal functions. Both of these have been issues in the past. He is a smoker. Smokes less than a pack of cigarettes per day. His last chest x-ray was 2017 and was normal.     Current Medication: Outpatient Encounter Medications as of 07/01/2019  Medication Sig  . amLODipine (NORVASC) 2.5 MG tablet Take 1 tablet (2.5 mg total) by mouth daily.  Marland Kitchen aspirin EC 81 MG tablet Take 81 mg by mouth daily.  . pantoprazole (PROTONIX) 40 MG tablet Take 1 tablet (40 mg total) by mouth daily.  Marland Kitchen amoxicillin (AMOXIL) 875 MG tablet Take 1 tablet (875 mg total) by mouth 2 (two) times daily.  . budesonide-formoterol (SYMBICORT) 80-4.5 MCG/ACT inhaler Inhale 2 puffs into the lungs 2 (two) times daily.  . pneumococcal 13-valent conjugate vaccine (PREVNAR 13) SUSP injection Inject 0.5 mLs into the muscle once for 1 dose.   No facility-administered encounter medications on file as of 07/01/2019.     Surgical History: Past Surgical History:  Procedure Laterality Date  . VASECTOMY Bilateral 1991    Medical History: Past Medical History:  Diagnosis Date  . GERD (gastroesophageal reflux disease)   . Hypertension     Family History: Family History  Problem Relation Age of Onset  . Alzheimer's disease Mother   . Cancer Father       Review of Systems  Constitutional: Negative for chills, fatigue and unexpected weight change.  HENT: Negative for congestion, postnasal drip, rhinorrhea, sneezing and sore throat.   Respiratory:  Negative for cough, chest tightness, shortness of breath and wheezing.   Cardiovascular: Negative for chest pain and palpitations.       Mildly elevated blood pressure   Gastrointestinal: Negative for abdominal pain, constipation, diarrhea, nausea and vomiting.  Endocrine: Negative for cold intolerance, heat intolerance, polydipsia and polyuria.  Genitourinary: Negative for dysuria and frequency.  Musculoskeletal: Negative for arthralgias, back pain, joint swelling and neck pain.  Skin: Negative for rash.  Allergic/Immunologic: Negative for environmental allergies.  Neurological: Negative for dizziness, tremors, numbness and headaches.  Hematological: Negative for adenopathy. Does not bruise/bleed easily.  Psychiatric/Behavioral: Negative for behavioral problems (Depression), sleep disturbance and suicidal ideas. The patient is not nervous/anxious.      Today's Vitals   07/01/19 0911  BP: (!) 148/80  Pulse: 63  Resp: 16  SpO2: 100%  Weight: 173 lb (78.5 kg)  Height: 5\' 8"  (1.727 m)   Body mass index is 26.3 kg/m.  Physical Exam Vitals signs and nursing note reviewed.  Constitutional:      General: He is not in acute distress.    Appearance: Normal appearance. He is well-developed. He is not diaphoretic.  HENT:     Head: Normocephalic and atraumatic.     Nose: Nose normal.     Mouth/Throat:     Pharynx: No oropharyngeal exudate.  Eyes:     Extraocular Movements: Extraocular movements intact.     Pupils: Pupils are equal, round, and reactive to light.  Neck:     Musculoskeletal:  Normal range of motion and neck supple.     Thyroid: No thyromegaly.     Vascular: No carotid bruit or JVD.     Trachea: No tracheal deviation.  Cardiovascular:     Rate and Rhythm: Normal rate and regular rhythm.     Pulses: Normal pulses.     Heart sounds: Normal heart sounds. No murmur. No friction rub. No gallop.   Pulmonary:     Effort: Pulmonary effort is normal. No respiratory distress.      Breath sounds: Normal breath sounds. No wheezing or rales.  Chest:     Chest wall: No tenderness.  Abdominal:     General: Bowel sounds are normal.     Palpations: Abdomen is soft.     Tenderness: There is no abdominal tenderness.  Musculoskeletal: Normal range of motion.  Lymphadenopathy:     Cervical: No cervical adenopathy.  Skin:    General: Skin is warm and dry.  Neurological:     Mental Status: He is alert and oriented to person, place, and time.     Cranial Nerves: No cranial nerve deficit.  Psychiatric:        Behavior: Behavior normal.        Thought Content: Thought content normal.        Judgment: Judgment normal.    Depression screen [facility] 2/9 07/01/2019 07/01/2019 07/01/2019 12/27/2018 06/26/2018  Decreased Interest 0 0 0 0 0  Down, Depressed, Hopeless 0 0 0 0 0  PHQ - 2 Score 0 0 0 0 0    Functional Status Survey: Is the patient deaf or have difficulty hearing?: No Does the patient have difficulty seeing, even when wearing glasses/contacts?: No Does the patient have difficulty concentrating, remembering, or making decisions?: No Does the patient have difficulty walking or climbing stairs?: No Does the patient have difficulty dressing or bathing?: No Does the patient have difficulty doing errands alone such as visiting a doctor's office or shopping?: No  MMSE - [place] Exam 07/01/2019 06/26/2018  Orientation to time 5 5  Orientation to Place 5 5  Registration 3 3  Attention/ Calculation 5 5  Recall 3 3  Language- name 2 objects 2 2  Language- repeat 1 1  Language- follow 3 step command 3 3  Language- read & follow direction 0 1  Write a sentence 0 1  Copy design 1 1  Total score 28 30    Fall Risk  07/01/2019 07/01/2019 12/27/2018 06/26/2018 12/12/2017  Falls in the past year? 0 0 0 0 No      LABS: Recent Results (from the past 2160 hour(s))  Comprehensive metabolic panel     Status: Abnormal   Collection Time: 05/06/19  9:42 AM   Result Value Ref Range   Glucose 92 65 - 99 mg/dL   BUN 18 8 - 27 mg/dL   Creatinine, Ser 1.49 (H) 0.76 - 1.27 mg/dL   GFR calc non Af Amer 48 (L) >59 mL/min/1.73   GFR calc Af Amer 56 (L) >59 mL/min/1.73   BUN/Creatinine Ratio 12 10 - 24   Sodium 140 134 - 144 mmol/L   Potassium 4.8 3.5 - 5.2 mmol/L   Chloride 103 96 - 106 mmol/L   CO2 23 20 - 29 mmol/L   Calcium 9.4 8.6 - 10.2 mg/dL   Total Protein 6.7 6.0 - 8.5 g/dL   Albumin 4.2 3.8 - 4.8 g/dL   Globulin, Total 2.5 1.5 - 4.5 g/dL   Albumin/Globulin Ratio  1.7 1.2 - 2.2   Bilirubin Total 1.2 0.0 - 1.2 mg/dL   Alkaline Phosphatase 67 39 - 117 IU/L   AST 12 0 - 40 IU/L   ALT 13 0 - 44 IU/L  CBC     Status: Abnormal   Collection Time: 05/06/19  9:42 AM  Result Value Ref Range   WBC 12.7 (H) 3.4 - 10.8 x10E3/uL   RBC 4.94 4.14 - 5.80 x10E6/uL   Hemoglobin 15.4 13.0 - 17.7 g/dL   Hematocrit 48.2 37.5 - 51.0 %   MCV 98 (H) 79 - 97 fL   MCH 31.2 26.6 - 33.0 pg   MCHC 32.0 31.5 - 35.7 g/dL   RDW 12.0 11.6 - 15.4 %   Platelets 300 150 - 450 x10E3/uL  Lipid Panel w/o Chol/HDL Ratio     Status: Abnormal   Collection Time: 05/06/19  9:42 AM  Result Value Ref Range   Cholesterol, Total 200 (H) 100 - 199 mg/dL   Triglycerides 128 0 - 149 mg/dL   HDL 44 >39 mg/dL   VLDL Cholesterol Cal 23 5 - 40 mg/dL   LDL Chol Calc (NIH) 133 (H) 0 - 99 mg/dL  T4, free     Status: None   Collection Time: 05/06/19  9:42 AM  Result Value Ref Range   Free T4 1.30 0.82 - 1.77 ng/dL  TSH     Status: None   Collection Time: 05/06/19  9:42 AM  Result Value Ref Range   TSH 2.230 0.450 - 4.500 uIU/mL  PSA     Status: None   Collection Time: 05/06/19  9:42 AM  Result Value Ref Range   Prostate Specific Ag, Serum 0.6 0.0 - 4.0 ng/mL    Comment: Roche ECLIA methodology. According to the American Urological Association, Serum PSA should decrease and remain at undetectable levels after radical prostatectomy. The AUA defines biochemical recurrence as an  initial PSA value 0.2 ng/mL or greater followed by a subsequent confirmatory PSA value 0.2 ng/mL or greater. Values obtained with different assay methods or kits cannot be used interchangeably. Results cannot be interpreted as absolute evidence of the presence or absence of malignant disease.      Assessment/Plan: 1. Encounter for general adult medical examination with abnormal findings Annual health maintenance exam today  2. Essential hypertension Stable. Continue bp medication as prescribed   3. Leukocytosis, unspecified type Treat with amoxicillin 875mg  twice daily for 7 days. Recheck WBC count after that. Treat as indicated. Will get chest x-ray - amoxicillin (AMOXIL) 875 MG tablet; Take 1 tablet (875 mg total) by mouth 2 (two) times daily.  Dispense: 14 tablet; Refill: 0  4. Abnormal kidney function Recheck renal functions after treatment with antibiotics. Consider ultrasound of kidneys and bladder if still elevated.   5. Need for vaccination against Streptococcus pneumoniae using pneumococcal conjugate vaccine 13 Prescription for prevnar 13 sent to his pharmacy for administration.  - pneumococcal 13-valent conjugate vaccine (PREVNAR 13) SUSP injection; Inject 0.5 mLs into the muscle once for 1 dose.  Dispense: 0.5 mL; Refill: 0  6. Encounter for hepatitis C screening test for low risk patient  hepatitis C screen.  7. Cigarette smoker - DG Chest 2 View; Future  General Counseling: Fields verbalizes understanding of the findings of todays visit and agrees with plan of treatment. I have discussed any further diagnostic evaluation that may be needed or ordered today. We also reviewed his medications today. he has been encouraged to call the office with  any questions or concerns that should arise related to todays visit.    Counseling:  This patient was seen by Leretha Pol FNP Collaboration with Dr Lavera Guise as a part of collaborative care agreement  Orders Placed  This Encounter  Procedures  . DG Chest 2 View    Meds ordered this encounter  Medications  . amoxicillin (AMOXIL) 875 MG tablet    Sig: Take 1 tablet (875 mg total) by mouth 2 (two) times daily.    Dispense:  14 tablet    Refill:  0    Order Specific Question:   Supervising Provider    Answer:   Lavera Guise [id number]  . pneumococcal 13-valent conjugate vaccine (PREVNAR 13) SUSP injection    Sig: Inject 0.5 mLs into the muscle once for 1 dose.    Dispense:  0.5 mL    Refill:  0    Order Specific Question:   Supervising Provider    Answer:   Lavera Guise [id number]  . budesonide-formoterol (SYMBICORT) 80-4.5 MCG/ACT inhaler    Sig: Inhale 2 puffs into the lungs 2 (two) times daily.    Dispense:  1 Inhaler    Refill:  12    Patient given a manufacturer copay card.    Order Specific Question:   Supervising Provider    Answer:   Lavera Guise [id number]    Time spent: [place], MD  Internal Medicine

## 2019-07-02 ENCOUNTER — Other Ambulatory Visit: Payer: Self-pay | Admitting: Nurse Practitioner

## 2019-07-02 DIAGNOSIS — R319 Hematuria, unspecified: Secondary | ICD-10-CM

## 2019-07-02 LAB — MICROSCOPIC EXAMINATION
Casts: NONE SEEN /lpf
Epithelial Cells (non renal): NONE SEEN /hpf (ref 0–10)

## 2019-07-02 LAB — UA/M W/RFLX CULTURE, ROUTINE
Bilirubin, UA: NEGATIVE
Ketones, UA: NEGATIVE
Leukocytes,UA: NEGATIVE
Nitrite, UA: NEGATIVE
Protein,UA: NEGATIVE
Specific Gravity, UA: 1.021 (ref 1.005–1.030)
Urobilinogen, Ur: 0.2 mg/dL (ref 0.2–1.0)
pH, UA: 5 (ref 5.0–7.5)

## 2019-07-02 NOTE — Progress Notes (Signed)
Hey. Can you let the patient know that urine sample from yesterday did show presence of blood in his urine. I would like to go ahead and set him up with ultrasound of kidneys and bladder for further evaluation.  He should have follow up after the ultrasounds, about a week after that. Thanks.

## 2019-07-03 ENCOUNTER — Telehealth: Payer: Self-pay

## 2019-07-03 NOTE — Telephone Encounter (Signed)
-----   Message from Ronnell Freshwater, NP sent at 07/01/2019  3:06 PM EST ----- Please let the patient know that chest x-ray appears normal. Thanks.

## 2019-07-03 NOTE — Telephone Encounter (Signed)
Pt advised chest xray normal 

## 2019-07-17 ENCOUNTER — Other Ambulatory Visit: Payer: Self-pay | Admitting: Nurse Practitioner

## 2019-07-17 DIAGNOSIS — Z1159 Encounter for screening for other viral diseases: Secondary | ICD-10-CM | POA: Diagnosis not present

## 2019-07-17 DIAGNOSIS — N289 Disorder of kidney and ureter, unspecified: Secondary | ICD-10-CM | POA: Diagnosis not present

## 2019-07-17 DIAGNOSIS — D72829 Elevated white blood cell count, unspecified: Secondary | ICD-10-CM | POA: Diagnosis not present

## 2019-07-18 LAB — BASIC METABOLIC PANEL
BUN/Creatinine Ratio: 12 (ref 10–24)
BUN: 16 mg/dL (ref 8–27)
CO2: 23 mmol/L (ref 20–29)
Calcium: 9.4 mg/dL (ref 8.6–10.2)
Chloride: 102 mmol/L (ref 96–106)
Creatinine, Ser: 1.29 mg/dL — ABNORMAL HIGH (ref 0.76–1.27)
GFR calc Af Amer: 66 mL/min/{1.73_m2} (ref >59)
GFR calc non Af Amer: 57 mL/min/{1.73_m2} — ABNORMAL LOW (ref >59)
Glucose: 92 mg/dL (ref 65–99)
Potassium: 4.6 mmol/L (ref 3.5–5.2)
Sodium: 137 mmol/L (ref 134–144)

## 2019-07-18 LAB — CBC
Hematocrit: 43.8 % (ref 37.5–51.0)
Hemoglobin: 15.1 g/dL (ref 13.0–17.7)
MCH: 32 pg (ref 26.6–33.0)
MCHC: 34.5 g/dL (ref 31.5–35.7)
MCV: 93 fL (ref 79–97)
Platelets: 298 10*3/uL (ref 150–450)
RBC: 4.72 x10E6/uL (ref 4.14–5.80)
RDW: 11.8 % (ref 11.6–15.4)
WBC: 12.1 10*3/uL — ABNORMAL HIGH (ref 3.4–10.8)

## 2019-07-18 LAB — HCV INTERPRETATION

## 2019-07-18 LAB — HCV AB W REFLEX TO QUANT PCR: HCV Ab: <0.1 s/co ratio (ref 0.0–0.9)

## 2019-07-19 NOTE — Progress Notes (Signed)
Improved labs - negative chest x-ray. Review with patient 08/07/2019

## 2019-07-26 DIAGNOSIS — H2513 Age-related nuclear cataract, bilateral: Secondary | ICD-10-CM | POA: Diagnosis not present

## 2019-07-31 ENCOUNTER — Telehealth: Payer: Self-pay

## 2019-07-31 NOTE — Telephone Encounter (Signed)
Confirmed appointment with patient. klh °

## 2019-08-02 ENCOUNTER — Ambulatory Visit (INDEPENDENT_AMBULATORY_CARE_PROVIDER_SITE_OTHER): Payer: Medicare Other

## 2019-08-02 ENCOUNTER — Other Ambulatory Visit: Payer: Self-pay

## 2019-08-02 ENCOUNTER — Encounter (INDEPENDENT_AMBULATORY_CARE_PROVIDER_SITE_OTHER): Payer: Self-pay

## 2019-08-02 DIAGNOSIS — R319 Hematuria, unspecified: Secondary | ICD-10-CM

## 2019-08-05 ENCOUNTER — Telehealth: Payer: Self-pay

## 2019-08-05 NOTE — Telephone Encounter (Signed)
CONFIRMED 08-07-19 OV AS VIRTUAL.

## 2019-08-07 ENCOUNTER — Encounter: Payer: Self-pay | Admitting: Nurse Practitioner

## 2019-08-07 ENCOUNTER — Ambulatory Visit (INDEPENDENT_AMBULATORY_CARE_PROVIDER_SITE_OTHER): Payer: Medicare Other | Admitting: Nurse Practitioner

## 2019-08-07 ENCOUNTER — Other Ambulatory Visit: Payer: Self-pay

## 2019-08-07 VITALS — Ht 68.0 in

## 2019-08-07 DIAGNOSIS — N289 Disorder of kidney and ureter, unspecified: Secondary | ICD-10-CM

## 2019-08-07 DIAGNOSIS — R319 Hematuria, unspecified: Secondary | ICD-10-CM | POA: Diagnosis not present

## 2019-08-07 DIAGNOSIS — I10 Essential (primary) hypertension: Secondary | ICD-10-CM

## 2019-08-07 NOTE — Progress Notes (Signed)
[facility] [place], [address] [address]  Internal MEDICINE  Telephone Visit  Patient Name: Adam Hurst  [id number]  [id number]  Date of Service: 08/07/2019  I connected with the patient at 9:07am by webcam and verified the patients identity using two identifiers.   I discussed the limitations, risks, security and privacy concerns of performing an evaluation and management service by webcam and the availability of in person appointments. I also discussed with the patient that there may be a patient responsible charge related to the service.  The patient expressed understanding and agrees to proceed.    Chief Complaint  Patient presents with  . Telephone Assessment  . Telephone Screen  . Follow-up    review labs and ultrasound     The patient has been contacted via webcam for follow up visit due to concerns for spread of novel coronavirus. The patient presents for follow up visit. He had abnormal renal functions and blood in his urine at his appointment for physical exam. He has not had kidney stones in the past and reports no signs and symptoms of urinary tract infection. He had repeat BMP done which showed improving renal functions. A repeat urinalysis showed nearly resolved hematuria.  A renal ultrasound was also done. He has a small renal cyst in the right kidney and no other abnormalities identified. The bladder was unremarkable. The patient has no new concerns or complaints today.       Current Medication: Outpatient Encounter Medications as of 08/07/2019  Medication Sig  . amLODipine (NORVASC) 2.5 MG tablet Take 1 tablet (2.5 mg total) by mouth daily.  Marland Kitchen aspirin EC 81 MG tablet Take 81 mg by mouth daily.  . budesonide-formoterol (SYMBICORT) 80-4.5 MCG/ACT inhaler Inhale 2 puffs into the lungs 2 (two) times daily.  . pantoprazole (PROTONIX) 40 MG tablet Take 1 tablet (40 mg total) by mouth daily.  . [DISCONTINUED] amoxicillin (AMOXIL) 875 MG tablet  Take 1 tablet (875 mg total) by mouth 2 (two) times daily. (Patient not taking: Reported on 08/07/2019)   No facility-administered encounter medications on file as of 08/07/2019.    Surgical History: Past Surgical History:  Procedure Laterality Date  . VASECTOMY Bilateral 1991    Medical History: Past Medical History:  Diagnosis Date  . GERD (gastroesophageal reflux disease)   . Hypertension     Family History: Family History  Problem Relation Age of Onset  . Alzheimer's disease Mother   . Cancer Father     Social History   Socioeconomic History  . Marital status: Married    Spouse name: Not on file  . Number of children: Not on file  . Years of education: Not on file  . Highest education level: Not on file  Occupational History  . Not on file  Tobacco Use  . Smoking status: Current Every Day Smoker    Packs/day: 1.00    Types: Cigarettes  . Smokeless tobacco: Never Used  Substance and Sexual Activity  . Alcohol use: Yes    Comment: rare  . Drug use: Never  . Sexual activity: Not on file  Other Topics Concern  . Not on file  Social History Narrative  . Not on file   Social Determinants of Health   Financial Resource Strain:   . Difficulty of Paying Living Expenses: Not on file  Food Insecurity:   . Worried About Charity fundraiser in the Last Year: Not on file  . Ran Out of Food  in the Last Year: Not on file  Transportation Needs:   . Lack of Transportation (Medical): Not on file  . Lack of Transportation (Non-Medical): Not on file  Physical Activity:   . Days of Exercise per Week: Not on file  . Minutes of Exercise per Session: Not on file  Stress:   . Feeling of Stress : Not on file  Social Connections:   . Frequency of Communication with Friends and Family: Not on file  . Frequency of Social Gatherings with Friends and Family: Not on file  . Attends Religious Services: Not on file  . Active Member of Clubs or Organizations: Not on file  .  Attends Archivist Meetings: Not on file  . Marital Status: Not on file  Intimate Partner Violence:   . Fear of Current or Ex-Partner: Not on file  . Emotionally Abused: Not on file  . Physically Abused: Not on file  . Sexually Abused: Not on file      Review of Systems  Constitutional: Negative for chills, fatigue and unexpected weight change.  HENT: Negative for congestion, postnasal drip, rhinorrhea, sneezing and sore throat.   Respiratory: Negative for cough, chest tightness, shortness of breath and wheezing.   Cardiovascular: Negative for chest pain and palpitations.  Gastrointestinal: Negative for abdominal pain, constipation, diarrhea, nausea and vomiting.  Endocrine: Negative for cold intolerance, heat intolerance, polydipsia and polyuria.  Genitourinary: Negative for dysuria, frequency and hematuria.  Musculoskeletal: Negative for arthralgias, back pain, joint swelling and neck pain.  Skin: Negative for rash.  Allergic/Immunologic: Negative for environmental allergies.  Neurological: Negative for dizziness, tremors, numbness and headaches.  Hematological: Negative for adenopathy. Does not bruise/bleed easily.  Psychiatric/Behavioral: Negative for behavioral problems (Depression), sleep disturbance and suicidal ideas. The patient is not nervous/anxious.     Vital Signs: Ht 5\' 8"  (1.727 m)   BMI 26.30 kg/m    Observation/Objective:  The patient is alert and oriented. He is pleasant and answering all questions appropriately. Breathing is non-labored. He is in no acute distress.    Assessment/Plan: 1. Abnormal kidney function Improved on most recent BMP. Will continue to monitor.   2. Hematuria, unspecified type Nearly resolved. Reviewed results of renal/bladder ultrasound. Small, right renal cyst with no other abnormalities. Will repeat ultrasound in one year for surveillance.   3. Essential hypertension Stable.   General Counseling: [name] verbalizes  understanding of the findings of today's phone visit and agrees with plan of treatment. I have discussed any further diagnostic evaluation that may be needed or ordered today. We also reviewed his medications today. he has been encouraged to call the office with any questions or concerns that should arise related to todays visit.  This patient was seen by Leretha Pol FNP Collaboration with Dr Lavera Guise as a part of collaborative care agreement   Time spent: 103 Minutes    Dr Lavera Guise Internal medicine

## 2019-08-07 NOTE — Progress Notes (Signed)
Reviewed with patient during visit 08/07/2019

## 2019-08-17 DIAGNOSIS — M545 Low back pain: Secondary | ICD-10-CM | POA: Diagnosis not present

## 2019-08-19 ENCOUNTER — Emergency Department: Payer: Medicare Other

## 2019-08-19 ENCOUNTER — Other Ambulatory Visit: Payer: Self-pay

## 2019-08-19 ENCOUNTER — Encounter: Payer: Self-pay | Admitting: Emergency Medicine

## 2019-08-19 ENCOUNTER — Emergency Department
Admission: EM | Admit: 2019-08-19 | Discharge: 2019-08-19 | Disposition: A | Discharge status: Home / Self Care | Payer: Medicare Other | Attending: Emergency Medicine | Admitting: Emergency Medicine

## 2019-08-19 DIAGNOSIS — R079 Chest pain, unspecified: Secondary | ICD-10-CM | POA: Diagnosis not present

## 2019-08-19 DIAGNOSIS — F17210 Nicotine dependence, cigarettes, uncomplicated: Secondary | ICD-10-CM | POA: Diagnosis not present

## 2019-08-19 DIAGNOSIS — Z7982 Long term (current) use of aspirin: Secondary | ICD-10-CM | POA: Insufficient documentation

## 2019-08-19 DIAGNOSIS — I10 Essential (primary) hypertension: Secondary | ICD-10-CM | POA: Insufficient documentation

## 2019-08-19 DIAGNOSIS — M545 Low back pain, unspecified: Secondary | ICD-10-CM

## 2019-08-19 DIAGNOSIS — Z79899 Other long term (current) drug therapy: Secondary | ICD-10-CM | POA: Diagnosis not present

## 2019-08-19 LAB — BASIC METABOLIC PANEL
Anion gap: 9 (ref 5–15)
BUN: 18 mg/dL (ref 8–23)
CO2: 25 mmol/L (ref 22–32)
Calcium: 9 mg/dL (ref 8.9–10.3)
Chloride: 102 mmol/L (ref 98–111)
Creatinine, Ser: 1.25 mg/dL — ABNORMAL HIGH (ref 0.61–1.24)
GFR calc Af Amer: >60 mL/min (ref >60)
GFR calc non Af Amer: 60 mL/min — ABNORMAL LOW (ref >60)
Glucose, Bld: 84 mg/dL (ref 70–99)
Potassium: 4.3 mmol/L (ref 3.5–5.1)
Sodium: 136 mmol/L (ref 135–145)

## 2019-08-19 LAB — CBC
HCT: 44.6 % (ref 39.0–52.0)
Hemoglobin: 15 g/dL (ref 13.0–17.0)
MCH: 30.7 pg (ref 26.0–34.0)
MCHC: 33.6 g/dL (ref 30.0–36.0)
MCV: 91.4 fL (ref 80.0–100.0)
Platelets: 296 10*3/uL (ref 150–400)
RBC: 4.88 MIL/uL (ref 4.22–5.81)
RDW: 13.4 % (ref 11.5–15.5)
WBC: 10.4 10*3/uL (ref 4.0–10.5)
nRBC: 0 % (ref 0.0–0.2)

## 2019-08-19 LAB — TROPONIN I (HIGH SENSITIVITY): Troponin I (High Sensitivity): 6 ng/L (ref <18)

## 2019-08-19 NOTE — ED Provider Notes (Signed)
[facility] Emergency Department Provider Note   ____________________________________________    I have reviewed the triage vital signs and the nursing notes.   HISTORY  Chief Complaint Evaluation for possible heart attack    HPI Adam Hurst is a 69 y.o. male who presents for evaluation because he was told by urgent care that he "likely already had a heart attack "several days ago.  He had gone to urgent care at that time because he was having low back pain with radiation into his left leg.  He denies any chest pain.  No diaphoresis.  No shortness of breath.  No pleurisy.  No nausea vomiting.  His low back pain has resolved  Past Medical History:  Diagnosis Date  . GERD (gastroesophageal reflux disease)   . Hypertension     Patient Active Problem List   Diagnosis Date Noted  . Hematuria 08/07/2019  . Encounter for general adult medical examination with abnormal findings 07/01/2019  . Encounter for hepatitis C screening test for low risk patient 07/01/2019  . Screening for colon cancer 06/26/2018  . Neoplasm of uncertain behavior of skin of lower extremity 06/26/2018  . Abnormal kidney function 06/26/2018  . Need for vaccination against Streptococcus pneumoniae using pneumococcal conjugate vaccine 13 06/26/2018  . Essential hypertension 01/03/2018  . Gastroesophageal reflux disease without esophagitis 01/03/2018  . Leukocytosis 01/03/2018  . Cigarette smoker 01/03/2018    Past Surgical History:  Procedure Laterality Date  . VASECTOMY Bilateral 1991    Prior to Admission medications   Medication Sig Start Date End Date Taking? Authorizing Provider  amLODipine (NORVASC) 2.5 MG tablet Take 1 tablet (2.5 mg total) by mouth daily. 03/13/19   Ronnell Freshwater, NP  aspirin EC 81 MG tablet Take 81 mg by mouth daily.    [provider]  budesonide-formoterol (SYMBICORT) 80-4.5 MCG/ACT inhaler Inhale 2 puffs into the lungs 2 (two)  times daily. 07/01/19   Ronnell Freshwater, NP  pantoprazole (PROTONIX) 40 MG tablet Take 1 tablet (40 mg total) by mouth daily. 12/27/18   Ronnell Freshwater, NP     Allergies Patient has no known allergies.  Family History  Problem Relation Age of Onset  . Alzheimer's disease Mother   . Cancer Father     Social History Social History   Tobacco Use  . Smoking status: Current Every Day Smoker    Packs/day: 1.00    Types: Cigarettes  . Smokeless tobacco: Never Used  Substance Use Topics  . Alcohol use: Yes    Comment: rare  . Drug use: Never    Review of Systems  Constitutional: No fever/chills Eyes: No visual changes.  ENT: No sore throat. Cardiovascular: Denies chest pain. Respiratory: Denies shortness of breath. Gastrointestinal: No abdominal pain.    Genitourinary: Negative for dysuria. Musculoskeletal: Negative for back pain. Skin: Negative for rash. Neurological: Negative for headaches   ____________________________________________   PHYSICAL EXAM:  VITAL SIGNS: ED Triage Vitals  Enc Vitals Group     BP 08/19/19 0944 (!) 160/86     Pulse Rate 08/19/19 0944 71     Resp 08/19/19 0944 19     Temp 08/19/19 0944 98.5 F (36.9 C)     Temp Source 08/19/19 0944 Oral     SpO2 08/19/19 0944 100 %     Weight 08/19/19 0941 79.4 kg (175 lb)     Height 08/19/19 0941 1.727 m (5\' 8" )     Head Circumference --  Peak Flow --      Pain Score 08/19/19 0941 0     Pain Loc --      Pain Edu? --      Excl. in [place]? --     Constitutional: Alert and oriented.   Nose: No congestion/rhinnorhea. Mouth/Throat: Mucous membranes are moist.    Cardiovascular: Normal rate, regular rhythm.  Good peripheral circulation. Respiratory: Normal respiratory effort.  No retractions.  Gastrointestinal: Soft and nontender. No distention.   Musculoskeletal:Warm and well perfused Neurologic:  Normal speech and language. No gross focal neurologic deficits are appreciated.  Skin:   Skin is warm, dry and intact. No rash noted. Psychiatric: Mood and affect are normal. Speech and behavior are normal.  ____________________________________________   LABS (all labs ordered are listed, but only abnormal results are displayed)  Labs Reviewed  BASIC METABOLIC PANEL - Abnormal; Notable for the following components:      Result Value   Creatinine, Ser 1.25 (*)    GFR calc non Af Amer 60 (*)    All other components within normal limits  CBC  TROPONIN I (HIGH SENSITIVITY)  TROPONIN I (HIGH SENSITIVITY)   ____________________________________________  EKG  ED ECG REPORT I, Lavonia Drafts, the attending physician, personally viewed and interpreted this ECG.  Date: 08/19/2019  Rhythm: normal sinus rhythm QRS Axis: normal Intervals: normal ST/T Wave abnormalities: normal Narrative Interpretation: no evidence of acute ischemia  ____________________________________________  RADIOLOGY  Chest x-ray unremarkable ____________________________________________   PROCEDURES  Procedure(s) performed: No  Procedures   Critical Care performed: No ____________________________________________   INITIAL IMPRESSION / ASSESSMENT AND PLAN / ED COURSE  Pertinent labs & imaging results that were available during my care of the patient were reviewed by me and considered in my medical decision making (see chart for details).  Patient is asymptomatic has not had any chest pain.  His low back pain likely musculoskeletal has resolved.  Unclear why he was told he may have had a heart attack, no Q waves on EKG.  Troponin here is normal.  No chest pain.  Reassurance provided, appropriate for discharge at this time    ____________________________________________   FINAL CLINICAL IMPRESSION(S) / ED DIAGNOSES  Final diagnoses:  Acute left-sided low back pain without sciatica        Note:  This document was prepared using [name] voice recognition software and may include  unintentional dictation errors.   Lavonia Drafts, MD 08/19/19 1501

## 2019-08-19 NOTE — ED Notes (Signed)
Dr. Corky Downs ar bedside.

## 2019-08-19 NOTE — ED Triage Notes (Signed)
Pt states thought he had a pinched nerve in his back so he went [facility] and they told him that he had already a heart attack. He was told to come to the ED but did not at the time.  Pt states he is here for a follow up from that. Pt denies all sx's.

## 2019-09-18 ENCOUNTER — Other Ambulatory Visit: Payer: Self-pay

## 2019-09-18 DIAGNOSIS — I10 Essential (primary) hypertension: Secondary | ICD-10-CM

## 2019-09-18 MED ORDER — AMLODIPINE BESYLATE 2.5 MG PO TABS: 2.5000 mg | ORAL_TABLET | Freq: Every day | ORAL | 2 refills | Status: DC

## 2019-10-25 ENCOUNTER — Telehealth: Payer: Self-pay

## 2019-10-25 NOTE — Telephone Encounter (Signed)
CONFIRMED AND SCREENED FOR 10-29-19 OV. 

## 2019-10-29 ENCOUNTER — Other Ambulatory Visit: Payer: Self-pay

## 2019-10-29 ENCOUNTER — Encounter: Payer: Self-pay | Admitting: Nurse Practitioner

## 2019-10-29 ENCOUNTER — Ambulatory Visit (INDEPENDENT_AMBULATORY_CARE_PROVIDER_SITE_OTHER): Payer: Medicare Other | Admitting: Nurse Practitioner

## 2019-10-29 VITALS — BP 161/87 | HR 59 | Temp 97.9°F | Resp 16 | Ht 68.0 in | Wt 176.8 lb

## 2019-10-29 DIAGNOSIS — J4520 Mild intermittent asthma, uncomplicated: Secondary | ICD-10-CM

## 2019-10-29 DIAGNOSIS — K429 Umbilical hernia without obstruction or gangrene: Secondary | ICD-10-CM

## 2019-10-29 DIAGNOSIS — I10 Essential (primary) hypertension: Secondary | ICD-10-CM | POA: Diagnosis not present

## 2019-10-29 MED ORDER — AMLODIPINE BESYLATE 5 MG PO TABS: 2.5000 mg | ORAL_TABLET | Freq: Every day | ORAL | 3 refills | Status: DC

## 2019-10-29 NOTE — Progress Notes (Signed)
[facility] [place], [place] [address]  Internal MEDICINE  Office Visit Note  Patient Name: Adam Hurst  [id number]  [id number]  Date of Service: 10/29/2019  Chief Complaint  Patient presents with  . Hypertension  . Gastroesophageal Reflux    The patient is here for follow up visit. Blood pressure is mildly elevated blood pressure. Blood pressure is generally elevated upon arrival to office. Will come down as he is here longer. He admits to having some intermittent abdominal tenderness. Has small hernia in abdomen, which has been acting up recently. Unsure of what is causing this. Was told by previous GI provider that it could be surgically repaired. Patient is not willing to have this procedure done at this point.       Current Medication: Outpatient Encounter Medications as of 10/29/2019  Medication Sig  . amLODipine (NORVASC) 5 MG tablet Take 0.5 tablets (2.5 mg total) by mouth daily.  Marland Kitchen aspirin EC 81 MG tablet Take 81 mg by mouth daily.  . budesonide-formoterol (SYMBICORT) 80-4.5 MCG/ACT inhaler Inhale 2 puffs into the lungs 2 (two) times daily.  . pantoprazole (PROTONIX) 40 MG tablet Take 1 tablet (40 mg total) by mouth daily.  . [DISCONTINUED] amLODipine (NORVASC) 2.5 MG tablet Take 1 tablet (2.5 mg total) by mouth daily.   No facility-administered encounter medications on file as of 10/29/2019.    Surgical History: Past Surgical History:  Procedure Laterality Date  . VASECTOMY Bilateral 1991    Medical History: Past Medical History:  Diagnosis Date  . GERD (gastroesophageal reflux disease)   . Hypertension     Family History: Family History  Problem Relation Age of Onset  . Alzheimer's disease Mother   . Cancer Father     Social History   Socioeconomic History  . Marital status: Married    Spouse name: Not on file  . Number of children: Not on file  . Years of education: Not on file  . Highest education level: Not on  file  Occupational History  . Not on file  Tobacco Use  . Smoking status: Current Every Day Smoker    Packs/day: 1.00    Types: Cigarettes  . Smokeless tobacco: Never Used  Substance and Sexual Activity  . Alcohol use: Yes    Comment: rare  . Drug use: Never  . Sexual activity: Not on file  Other Topics Concern  . Not on file  Social History Narrative  . Not on file   Social Determinants of Health   Financial Resource Strain:   . Difficulty of Paying Living Expenses:   Food Insecurity:   . Worried About Charity fundraiser in the Last Year:   . Arboriculturist in the Last Year:   Transportation Needs:   . Film/video editor (Medical):   Marland Kitchen Lack of Transportation (Non-Medical):   Physical Activity:   . Days of Exercise per Week:   . Minutes of Exercise per Session:   Stress:   . Feeling of Stress :   Social Connections:   . Frequency of Communication with Friends and Family:   . Frequency of Social Gatherings with Friends and Family:   . Attends Religious Services:   . Active Member of Clubs or Organizations:   . Attends Archivist Meetings:   Marland Kitchen Marital Status:   Intimate Partner Violence:   . Fear of Current or Ex-Partner:   . Emotionally Abused:   Marland Kitchen Physically Abused:   .  Sexually Abused:       Review of Systems  Constitutional: Negative for chills, fatigue and unexpected weight change.  HENT: Negative for congestion, postnasal drip, rhinorrhea, sneezing and sore throat.   Respiratory: Negative for cough, chest tightness, shortness of breath and wheezing.   Cardiovascular: Negative for chest pain and palpitations.       Blood pressure elevated   Gastrointestinal: Negative for abdominal pain, constipation, diarrhea, nausea and vomiting.       Small umbilical hernia.  Endocrine: Negative for cold intolerance, heat intolerance, polydipsia and polyuria.  Musculoskeletal: Negative for arthralgias, back pain, joint swelling and neck pain.  Skin:  Negative for rash.  Allergic/Immunologic: Negative for environmental allergies.  Neurological: Negative for dizziness, tremors, numbness and headaches.  Hematological: Negative for adenopathy. Does not bruise/bleed easily.  Psychiatric/Behavioral: Negative for behavioral problems (Depression), sleep disturbance and suicidal ideas. The patient is not nervous/anxious.     Today's Vitals   10/29/19 0849  BP: (!) 161/87  Pulse: (!) 59  Resp: 16  Temp: 97.9 F (36.6 C)  SpO2: 98%  Weight: 176 lb 12.8 oz (80.2 kg)  Height: 5\' 8"  (1.727 m)   Body mass index is 26.88 kg/m.  Physical Exam Vitals and nursing note reviewed.  Constitutional:      General: He is not in acute distress.    Appearance: Normal appearance. He is well-developed. He is not diaphoretic.  HENT:     Head: Normocephalic and atraumatic.     Nose: Nose normal.     Mouth/Throat:     Pharynx: No oropharyngeal exudate.  Eyes:     Extraocular Movements: Extraocular movements intact.     Pupils: Pupils are equal, round, and reactive to light.  Neck:     Thyroid: No thyromegaly.     Vascular: No carotid bruit or JVD.     Trachea: No tracheal deviation.  Cardiovascular:     Rate and Rhythm: Normal rate and regular rhythm.     Heart sounds: Normal heart sounds. No murmur. No friction rub. No gallop.   Pulmonary:     Effort: Pulmonary effort is normal. No respiratory distress.     Breath sounds: Normal breath sounds. No wheezing or rales.  Chest:     Chest wall: No tenderness.  Abdominal:     General: Bowel sounds are normal.     Palpations: Abdomen is soft.     Tenderness: There is no abdominal tenderness.  Musculoskeletal:        General: Normal range of motion.     Cervical back: Normal range of motion and neck supple.  Lymphadenopathy:     Cervical: No cervical adenopathy.  Skin:    General: Skin is warm and dry.  Neurological:     Mental Status: He is alert and oriented to person, place, and time.      Cranial Nerves: No cranial nerve deficit.  Psychiatric:        Behavior: Behavior normal.        Thought Content: Thought content normal.        Judgment: Judgment normal.   Assessment/Plan: 1. Essential hypertension Increase amlodipine to 5mg  daily. Limit salt and caffeine intake in the diet and increase water. Patient will monitor blood pressure more closely at home.  - amLODipine (NORVASC) 5 MG tablet; Take 0.5 tablets (2.5 mg total) by mouth daily.  Dispense: 30 tablet; Refill: 3  2. Mild intermittent asthma without complication Continue inhalers as prescribed   3. Umbilical hernia  without obstruction and without gangrene Small and currently not causing any issues. Will monitor closely and refer as indicated.   General Counseling: Adam Hurst verbalizes understanding of the findings of todays visit and agrees with plan of treatment. I have discussed any further diagnostic evaluation that may be needed or ordered today. We also reviewed his medications today. he has been encouraged to call the office with any questions or concerns that should arise related to todays visit.   Hypertension Counseling:   The following hypertensive lifestyle modification were recommended and discussed:  1. Limiting alcohol intake to less than 1 oz/day of ethanol:(24 oz of beer or 8 oz of wine or 2 oz of 100-proof whiskey). 2. Take baby ASA 81 mg daily. 3. Importance of regular aerobic exercise and losing weight. 4. Reduce dietary saturated fat and cholesterol intake for overall cardiovascular health. 5. Maintaining adequate dietary potassium, calcium, and magnesium intake. 6. Regular monitoring of the blood pressure. 7. Reduce sodium intake to less than 100 mmol/day (less than 2.3 gm of sodium or less than 6 gm of sodium choride)   This patient was seen by [place] with Dr Lavera Guise as a part of collaborative care agreement  Meds ordered this encounter  Medications  . amLODipine  (NORVASC) 5 MG tablet    Sig: Take 0.5 tablets (2.5 mg total) by mouth daily.    Dispense:  30 tablet    Refill:  3    Please note increased dose    Order Specific Question:   Supervising Provider    Answer:   Lavera Guise [id number]    Total time spent: 30 Minutes   Time spent includes review of chart, medications, test results, and follow up plan with the patient.      Dr Lavera Guise Internal medicine

## 2019-11-14 ENCOUNTER — Ambulatory Visit: Payer: 59 | Attending: Internal Medicine

## 2019-11-14 DIAGNOSIS — Z23 Encounter for immunization: Secondary | ICD-10-CM

## 2019-11-14 NOTE — Progress Notes (Signed)
   Covid-19 Vaccination Clinic  Name:  YOSVANI ALLARD    MRN: [id number] DOB: 07/07/1951  11/14/2019  Mr. Neira was observed post Covid-19 immunization for 15 minutes without incident. He was provided with Vaccine Information Sheet and instruction to access the V-Safe system.   Mr. Weishaupt was instructed to call 911 with any severe reactions post vaccine: Marland Kitchen Difficulty breathing  . Swelling of face and throat  . A fast heartbeat  . A bad rash all over body  . Dizziness and weakness   Immunizations Administered    Name Date Dose VIS Date Route   Pfizer COVID-19 Vaccine 11/14/2019  8:39 AM 0.3 mL 07/26/2019 Intramuscular   Manufacturer: [place]   Lot: [phone] [id number]   [place]: [id number]

## 2019-12-10 ENCOUNTER — Ambulatory Visit: Payer: 59 | Attending: Internal Medicine

## 2019-12-10 DIAGNOSIS — Z23 Encounter for immunization: Secondary | ICD-10-CM

## 2019-12-10 NOTE — Progress Notes (Signed)
   Covid-19 Vaccination Clinic  Name:  Adam Hurst    MRN: [id number] DOB: 11/29/1950  12/10/2019  Adam Hurst was observed post Covid-19 immunization for 15 minutes without incident. He was provided with Vaccine Information Sheet and instruction to access the V-Safe system.   Adam Hurst was instructed to call 911 with any severe reactions post vaccine: Marland Kitchen Difficulty breathing  . Swelling of face and throat  . A fast heartbeat  . A bad rash all over body  . Dizziness and weakness   Immunizations Administered    Name Date Dose VIS Date Route   Pfizer COVID-19 Vaccine 12/10/2019  9:02 AM 0.3 mL 10/09/2018 Intramuscular   Manufacturer: [employer]   Lot: [id number]   [place]: [id number]

## 2019-12-12 ENCOUNTER — Other Ambulatory Visit: Payer: Self-pay

## 2019-12-12 DIAGNOSIS — I10 Essential (primary) hypertension: Secondary | ICD-10-CM

## 2019-12-12 MED ORDER — AMLODIPINE BESYLATE 5 MG PO TABS: 2.5000 mg | ORAL_TABLET | Freq: Every day | ORAL | 3 refills | Status: DC

## 2019-12-16 ENCOUNTER — Other Ambulatory Visit: Payer: Self-pay

## 2019-12-16 DIAGNOSIS — I10 Essential (primary) hypertension: Secondary | ICD-10-CM

## 2019-12-16 MED ORDER — AMLODIPINE BESYLATE 5 MG PO TABS: 5.0000 mg | ORAL_TABLET | Freq: Every day | ORAL | 3 refills | Status: DC

## 2020-01-27 ENCOUNTER — Telehealth: Payer: Self-pay

## 2020-01-27 NOTE — Telephone Encounter (Signed)
Confirmed and screened for 01-29-20 ov.

## 2020-01-29 ENCOUNTER — Ambulatory Visit (INDEPENDENT_AMBULATORY_CARE_PROVIDER_SITE_OTHER): Payer: Medicare Other | Admitting: Nurse Practitioner

## 2020-01-29 ENCOUNTER — Encounter: Payer: Self-pay | Admitting: Nurse Practitioner

## 2020-01-29 ENCOUNTER — Other Ambulatory Visit: Payer: Self-pay

## 2020-01-29 VITALS — BP 148/88 | HR 64 | Temp 98.2°F | Resp 16 | Ht 68.0 in | Wt 166.8 lb

## 2020-01-29 DIAGNOSIS — K219 Gastro-esophageal reflux disease without esophagitis: Secondary | ICD-10-CM

## 2020-01-29 DIAGNOSIS — K439 Ventral hernia without obstruction or gangrene: Secondary | ICD-10-CM

## 2020-01-29 DIAGNOSIS — I10 Essential (primary) hypertension: Secondary | ICD-10-CM

## 2020-01-29 MED ORDER — AMLODIPINE BESYLATE 5 MG PO TABS: 5.0000 mg | ORAL_TABLET | Freq: Every day | ORAL | 3 refills | Status: DC

## 2020-01-29 MED ORDER — PANTOPRAZOLE SODIUM 40 MG PO TBEC: 40.0000 mg | DELAYED_RELEASE_TABLET | Freq: Every day | ORAL | 3 refills | Status: DC

## 2020-01-29 NOTE — Progress Notes (Signed)
[facility] [place], [place] [address]  Internal MEDICINE  Office Visit Note  Patient Name: Adam Hurst  [id number]  [id number]  Date of Service: 02/09/2020  Chief Complaint  Patient presents with  . Follow-up    increased amlodipine  . Gastroesophageal Reflux  . Hypertension    The patient is here for follow up of blood pressure. Increased dose of amlodipine from 2.5mg  to 5mg  daily. Blood pressure has responded well and he has no negative side effects from the increased dose.  Will be seeing GI provider tomorrow. Small ventral hernia was exacerbated a few weeks ago while he was out working in the yard. Got very painful and is unable to get it to go back into place.       Current Medication: Outpatient Encounter Medications as of 01/29/2020  Medication Sig  . amLODipine (NORVASC) 5 MG tablet Take 1 tablet (5 mg total) by mouth daily.  Marland Kitchen aspirin EC 81 MG tablet Take 81 mg by mouth daily.  . [DISCONTINUED] amLODipine (NORVASC) 5 MG tablet Take 1 tablet (5 mg total) by mouth daily.  . [DISCONTINUED] budesonide-formoterol (SYMBICORT) 80-4.5 MCG/ACT inhaler Inhale 2 puffs into the lungs 2 (two) times daily.  . [DISCONTINUED] pantoprazole (PROTONIX) 40 MG tablet Take 1 tablet (40 mg total) by mouth daily.  . [DISCONTINUED] pantoprazole (PROTONIX) 40 MG tablet Take 1 tablet (40 mg total) by mouth daily.   No facility-administered encounter medications on file as of 01/29/2020.    Surgical History: Past Surgical History:  Procedure Laterality Date  . VASECTOMY Bilateral 1991    Medical History: Past Medical History:  Diagnosis Date  . GERD (gastroesophageal reflux disease)   . Hypertension     Family History: Family History  Problem Relation Age of Onset  . Alzheimer's disease Mother   . Cancer Father     Social History   Socioeconomic History  . Marital status: Married    Spouse name: Not on file  . Number of children: Not on file   . Years of education: Not on file  . Highest education level: Not on file  Occupational History  . Not on file  Tobacco Use  . Smoking status: Current Every Day Smoker    Packs/day: 1.00    Types: Cigarettes  . Smokeless tobacco: Never Used  Vaping Use  . Vaping Use: Never used  Substance and Sexual Activity  . Alcohol use: Yes    Comment: rare  . Drug use: Never  . Sexual activity: Not on file  Other Topics Concern  . Not on file  Social History Narrative  . Not on file   Social Determinants of Health   Financial Resource Strain:   . Difficulty of Paying Living Expenses:   Food Insecurity:   . Worried About Charity fundraiser in the Last Year:   . Arboriculturist in the Last Year:   Transportation Needs:   . Film/video editor (Medical):   Marland Kitchen Lack of Transportation (Non-Medical):   Physical Activity:   . Days of Exercise per Week:   . Minutes of Exercise per Session:   Stress:   . Feeling of Stress :   Social Connections:   . Frequency of Communication with Friends and Family:   . Frequency of Social Gatherings with Friends and Family:   . Attends Religious Services:   . Active Member of Clubs or Organizations:   . Attends Archivist Meetings:   .  Marital Status:   Intimate Partner Violence:   . Fear of Current or Ex-Partner:   . Emotionally Abused:   Marland Kitchen Physically Abused:   . Sexually Abused:       Review of Systems  Constitutional: Negative for chills, fatigue and unexpected weight change.  HENT: Negative for congestion, postnasal drip, rhinorrhea, sneezing and sore throat.   Respiratory: Negative for cough, chest tightness, shortness of breath and wheezing.   Cardiovascular: Negative for chest pain and palpitations.       Blood pressure improved since increasing amlodipine to 5mg  daily.   Gastrointestinal: Negative for abdominal pain, constipation, diarrhea, nausea and vomiting.       Seeing GI provider tomorrow due to exacerbated hernia   Endocrine: Negative for cold intolerance, heat intolerance, polydipsia and polyuria.  Musculoskeletal: Negative for arthralgias, back pain, joint swelling and neck pain.  Skin: Negative for rash.  Allergic/Immunologic: Negative for environmental allergies.  Neurological: Negative for dizziness, tremors, numbness and headaches.  Hematological: Negative for adenopathy. Does not bruise/bleed easily.  Psychiatric/Behavioral: Negative for behavioral problems (Depression), sleep disturbance and suicidal ideas. The patient is not nervous/anxious.     Today's Vitals   01/29/20 0834  BP: (!) 148/88  Pulse: 64  Resp: 16  Temp: 98.2 F (36.8 C)  SpO2: 98%  Weight: 166 lb 12.8 oz (75.7 kg)  Height: 5\' 8"  (1.727 m)   Body mass index is 25.36 kg/m.  Physical Exam Vitals and nursing note reviewed.  Constitutional:      General: He is not in acute distress.    Appearance: Normal appearance. He is well-developed. He is not diaphoretic.  HENT:     Head: Normocephalic and atraumatic.     Nose: Nose normal.     Mouth/Throat:     Pharynx: No oropharyngeal exudate.  Eyes:     Extraocular Movements: Extraocular movements intact.     Pupils: Pupils are equal, round, and reactive to light.  Neck:     Thyroid: No thyromegaly.     Vascular: No carotid bruit or JVD.     Trachea: No tracheal deviation.  Cardiovascular:     Rate and Rhythm: Normal rate and regular rhythm.     Heart sounds: Normal heart sounds. No murmur heard.  No friction rub. No gallop.   Pulmonary:     Effort: Pulmonary effort is normal. No respiratory distress.     Breath sounds: Normal breath sounds. No wheezing or rales.  Chest:     Chest wall: No tenderness.  Abdominal:     General: Bowel sounds are normal.     Palpations: Abdomen is soft.     Tenderness: There is no abdominal tenderness.     Hernia: A hernia is present.     Comments: Moderate sized ventral/umbilical hernia present today. Tender to palpate.    Musculoskeletal:        General: Normal range of motion.     Cervical back: Normal range of motion and neck supple.  Lymphadenopathy:     Cervical: No cervical adenopathy.  Skin:    General: Skin is warm and dry.  Neurological:     Mental Status: He is alert and oriented to person, place, and time.     Cranial Nerves: No cranial nerve deficit.  Psychiatric:        Behavior: Behavior normal.        Thought Content: Thought content normal.        Judgment: Judgment normal.   Assessment/Plan: 1. Essential  hypertension Improved. Continue amlodipine 5mg  daily. New, 90 day prescription sent to pharmacy  - amLODipine (NORVASC) 5 MG tablet; Take 1 tablet (5 mg total) by mouth daily.  Dispense: 90 tablet; Refill: 3  2. Gastroesophageal reflux disease without esophagitis Continue protonix as prescribed   3. Ventral hernia without obstruction or gangrene Appointment with GI provider tomorrow for further evaluation and treatment.   General Counseling: Julio verbalizes understanding of the findings of todays visit and agrees with plan of treatment. I have discussed any further diagnostic evaluation that may be needed or ordered today. We also reviewed his medications today. he has been encouraged to call the office with any questions or concerns that should arise related to todays visit.  This patient was seen by [place] with Dr Lavera Guise as a part of collaborative care agreement  Meds ordered this encounter  Medications  . amLODipine (NORVASC) 5 MG tablet    Sig: Take 1 tablet (5 mg total) by mouth daily.    Dispense:  90 tablet    Refill:  3    Please note increased dose. Refill error on last fill. Patient takes one 5 mg tab now. Will run out of tablets before it's time. Needs to have new prescription soon. Please fill as 90 day prescription. Thanks.    Order Specific Question:   Supervising Provider    Answer:   Lavera Guise [[id number]]  . DISCONTD:  pantoprazole (PROTONIX) 40 MG tablet    Sig: Take 1 tablet (40 mg total) by mouth daily.    Dispense:  90 tablet    Refill:  3    Order Specific Question:   Supervising Provider    Answer:   Lavera Guise [[id number]]    Total time spent: 20 Minutes   Time spent includes review of chart, medications, test results, and follow up plan with the patient.      Dr Lavera Guise Internal medicine

## 2020-01-30 ENCOUNTER — Other Ambulatory Visit: Payer: Self-pay | Admitting: Gastroenterology

## 2020-01-30 DIAGNOSIS — K439 Ventral hernia without obstruction or gangrene: Secondary | ICD-10-CM

## 2020-01-30 DIAGNOSIS — K429 Umbilical hernia without obstruction or gangrene: Secondary | ICD-10-CM

## 2020-02-06 ENCOUNTER — Ambulatory Visit: Payer: 59

## 2020-02-09 DIAGNOSIS — K439 Ventral hernia without obstruction or gangrene: Secondary | ICD-10-CM | POA: Insufficient documentation

## 2020-02-10 ENCOUNTER — Other Ambulatory Visit: Payer: Self-pay

## 2020-02-10 ENCOUNTER — Ambulatory Visit
Admission: RE | Admit: 2020-02-10 | Discharge: 2020-02-10 | Disposition: A | Discharge status: Home / Self Care | Payer: Medicare Other | Source: Ambulatory Visit | Attending: Gastroenterology | Admitting: Gastroenterology

## 2020-02-10 DIAGNOSIS — K439 Ventral hernia without obstruction or gangrene: Secondary | ICD-10-CM | POA: Diagnosis not present

## 2020-02-10 DIAGNOSIS — K429 Umbilical hernia without obstruction or gangrene: Secondary | ICD-10-CM | POA: Insufficient documentation

## 2020-02-10 MED ORDER — IOHEXOL 300 MG/ML  SOLN
85.0000 mL | Freq: Once | INTRAMUSCULAR | Status: AC | PRN
  Administered 2020-02-10: 85 mL via INTRAVENOUS

## 2020-02-27 ENCOUNTER — Ambulatory Visit: Payer: Self-pay | Admitting: General Surgery

## 2020-02-27 DIAGNOSIS — K439 Ventral hernia without obstruction or gangrene: Secondary | ICD-10-CM | POA: Diagnosis not present

## 2020-02-27 NOTE — H&P (Signed)
PATIENT PROFILE: Adam Hurst is a 69 y.o. male who presents to the Clinic for consultation at the request of [place], NP for evaluation of ventral hernia.  PCP:  Khan, Fozia M, MD  HISTORY OF PRESENT ILLNESS: Adam Hurst reports having a ventral hernia since 25 years ago.  He reported that it was not causing any problem until this spring.  He reported that he was doing some gardening and he felt significant pain.  He also reported that the bulge is getting bigger.  The pain does not radiate to other part of the body.  Aggravating factor is doing heavy lifting.  Alleviating factor is resting and reducing the hernia.  Patient denies abdominal distention but he does endorses having some nausea with the pain.  Reports tolerating diet and having regular bowel movement.  He denies any previous abdominal surgery.   PROBLEM LIST:        Problem List  Never Reviewed       Noted   Abdominal pain 01/30/2020      GENERAL REVIEW OF SYSTEMS:   General ROS: negative for - chills, fatigue, fever, weight gain or weight loss Allergy and Immunology ROS: negative for - hives  Hematological and Lymphatic ROS: negative for - bleeding problems or bruising, negative for palpable nodes Endocrine ROS: negative for - heat or cold intolerance, hair changes Respiratory ROS: negative for - cough, shortness of breath or wheezing Cardiovascular ROS: no chest pain or palpitations GI ROS: negative for nausea, vomiting, abdominal pain, diarrhea, constipation Musculoskeletal ROS: negative for - joint swelling or muscle pain Neurological ROS: negative for - confusion, syncope Dermatological ROS: negative for pruritus and rash Psychiatric: negative for anxiety, depression, difficulty sleeping and memory loss  MEDICATIONS: Current Medications        Current Outpatient Medications  Medication Sig Dispense Refill  . amLODIPine (NORVASC) 5 MG tablet     . aspirin 81 MG EC tablet Take by mouth    .  pantoprazole (PROTONIX) 40 MG DR tablet      No current facility-administered medications for this visit.      ALLERGIES: Patient has no known allergies.  PAST MEDICAL HISTORY:     Past Medical History:  Diagnosis Date  . Colon polyp   . GERD (gastroesophageal reflux disease)   . HTN (hypertension)     PAST SURGICAL HISTORY:      Past Surgical History:  Procedure Laterality Date  . COLONOSCOPY    . VASECTOMY       FAMILY HISTORY:      Family History  Problem Relation Age of Onset  . Alzheimer's disease Mother      SOCIAL HISTORY: Social History          Socioeconomic History  . Marital status: Married    Spouse name: Not on file  . Number of children: Not on file  . Years of education: Not on file  . Highest education level: Not on file  Occupational History  . Not on file  Tobacco Use  . Smoking status: Current Every Day Smoker  . Smokeless tobacco: Never Used  Vaping Use  . Vaping Use: Never used  Substance and Sexual Activity  . Alcohol use: Yes    Comment: rare  . Drug use: Never  . Sexual activity: Not on file  Other Topics Concern  . Not on file  Social History Narrative  . Not on file   Social Determinants of Health      Financial   Resource Strain:   . Difficulty of Paying Living Expenses:   Food Insecurity:   . Worried About Charity fundraiser in the Last Year:   . Arboriculturist in the Last Year:   Transportation Needs:   . Film/video editor (Medical):   Marland Kitchen Lack of Transportation (Non-Medical):       PHYSICAL EXAM:    Vitals:   02/27/20 0941  BP: 134/82  Pulse: 62   Body mass index is 25.39 kg/m. Weight: 75.8 kg (167 lb)   GENERAL: Alert, active, oriented x3  HEENT: Pupils equal reactive to light. Extraocular movements are intact. Sclera clear. Palpebral conjunctiva normal red color.Pharynx clear.  NECK: Supple with no palpable mass and no adenopathy.  LUNGS: Sound clear with no  rales rhonchi or wheezes.  HEART: Regular rhythm S1 and S2 without murmur.  ABDOMEN: Soft and depressible, nontender with no palpable mass, no hepatomegaly.  Small reducible ventral hernia, mild tender to reduction.  EXTREMITIES: Well-developed well-nourished symmetrical with no dependent edema.  NEUROLOGICAL: Awake alert oriented, facial expression symmetrical, moving all extremities.  REVIEW OF DATA: I have reviewed the following data today:      Initial consult on 01/30/2020  Component Date Value  . WBC (White Blood Cell Co* 01/30/2020 12.4*  . RBC (Red Blood Cell Coun* 01/30/2020 4.71   . Hemoglobin 01/30/2020 14.9   . Hematocrit 01/30/2020 45.7   . MCV (Mean Corpuscular Vo* 01/30/2020 97.0   . MCH (Mean Corpuscular He* 01/30/2020 31.6*  . MCHC (Mean Corpuscular H* 01/30/2020 32.6   . Platelet Count 01/30/2020 282   . RDW-CV (Red Cell Distrib* 01/30/2020 13.8   . MPV (Mean Platelet Volum* 01/30/2020 10.5   . Neutrophils 01/30/2020 8.04*  . Lymphocytes 01/30/2020 3.17   . Monocytes 01/30/2020 1.00   . Eosinophils 01/30/2020 0.13   . Basophils 01/30/2020 0.05   . Neutrophil % 01/30/2020 64.8   . Lymphocyte % 01/30/2020 25.5   . Monocyte % 01/30/2020 8.1   . Eosinophil % 01/30/2020 1.0   . Basophil% 01/30/2020 0.4   . Immature Granulocyte % 01/30/2020 0.2   . Immature Granulocyte Cou* 01/30/2020 0.03   . Glucose 01/30/2020 103   . Sodium 01/30/2020 139   . Potassium 01/30/2020 4.4   . Chloride 01/30/2020 107   . Carbon Dioxide (CO2) 01/30/2020 27.4   . Urea Nitrogen (BUN) 01/30/2020 22   . Creatinine 01/30/2020 1.3   . Glomerular Filtration Ra* 01/30/2020 55*  . Calcium 01/30/2020 9.2   . AST  01/30/2020 14   . ALT  01/30/2020 14   . Alk Phos (alkaline Phosp* 01/30/2020 55   . Albumin 01/30/2020 4.3   . Bilirubin, Total 01/30/2020 1.0   . Protein, Total 01/30/2020 6.5   . A/G Ratio 01/30/2020 2.0      ASSESSMENT: Adam Hurst is a 69 y.o. male presenting  for consultation for ventral hernia.  Patient with physical exam and CT scan finding of ventral hernia.  I discussed with the patient the recommendation having surgical management due to the new symptoms of pain.  I discussed with the patient the different surgical approach including open versus robotic assisted laparoscopic ventral hernia repair.  I discussed with the patient the risk of surgery that includes: Pain, infection, bleeding, injury to intestine, injury to bladder, injury to vascular, bowel obstruction, among others.  Patient reported he understood and agreed to proceed.  Patient elected to proceed with robotic assisted laparoscopic ventral hernia repair.  Ventral hernia without obstruction or gangrene [K43.9]  PLAN: 1. Robotic assisted laparoscopic ventral hernia repair with mesh ([id number]) 2. CBC, CMP (done) 3. Hold aspirin 5 days before surgery 4. Contact us if you have any concern.   Patient and his wife verbalized understanding, all questions were answered, and were agreeable with the plan outlined above.   Ayinde Swim Cintron-Diaz, MD  Electronically signed by Teryn Gust Cintron-Diaz, MD  

## 2020-02-27 NOTE — H&P (View-Only) (Signed)
PATIENT PROFILE: Rosser L Klostermann is a 69 y.o. male who presents to the Clinic for consultation at the request of [place], NP for evaluation of ventral hernia.  PCP:  Khan, Fozia M, MD  HISTORY OF PRESENT ILLNESS: Mr. Parker reports having a ventral hernia since 25 years ago.  He reported that it was not causing any problem until this spring.  He reported that he was doing some gardening and he felt significant pain.  He also reported that the bulge is getting bigger.  The pain does not radiate to other part of the body.  Aggravating factor is doing heavy lifting.  Alleviating factor is resting and reducing the hernia.  Patient denies abdominal distention but he does endorses having some nausea with the pain.  Reports tolerating diet and having regular bowel movement.  He denies any previous abdominal surgery.   PROBLEM LIST:        Problem List  Never Reviewed       Noted   Abdominal pain 01/30/2020      GENERAL REVIEW OF SYSTEMS:   General ROS: negative for - chills, fatigue, fever, weight gain or weight loss Allergy and Immunology ROS: negative for - hives  Hematological and Lymphatic ROS: negative for - bleeding problems or bruising, negative for palpable nodes Endocrine ROS: negative for - heat or cold intolerance, hair changes Respiratory ROS: negative for - cough, shortness of breath or wheezing Cardiovascular ROS: no chest pain or palpitations GI ROS: negative for nausea, vomiting, abdominal pain, diarrhea, constipation Musculoskeletal ROS: negative for - joint swelling or muscle pain Neurological ROS: negative for - confusion, syncope Dermatological ROS: negative for pruritus and rash Psychiatric: negative for anxiety, depression, difficulty sleeping and memory loss  MEDICATIONS: Current Medications        Current Outpatient Medications  Medication Sig Dispense Refill  . amLODIPine (NORVASC) 5 MG tablet     . aspirin 81 MG EC tablet Take by mouth    .  pantoprazole (PROTONIX) 40 MG DR tablet      No current facility-administered medications for this visit.      ALLERGIES: Patient has no known allergies.  PAST MEDICAL HISTORY:     Past Medical History:  Diagnosis Date  . Colon polyp   . GERD (gastroesophageal reflux disease)   . HTN (hypertension)     PAST SURGICAL HISTORY:      Past Surgical History:  Procedure Laterality Date  . COLONOSCOPY    . VASECTOMY       FAMILY HISTORY:      Family History  Problem Relation Age of Onset  . Alzheimer's disease Mother      SOCIAL HISTORY: Social History          Socioeconomic History  . Marital status: Married    Spouse name: Not on file  . Number of children: Not on file  . Years of education: Not on file  . Highest education level: Not on file  Occupational History  . Not on file  Tobacco Use  . Smoking status: Current Every Day Smoker  . Smokeless tobacco: Never Used  Vaping Use  . Vaping Use: Never used  Substance and Sexual Activity  . Alcohol use: Yes    Comment: rare  . Drug use: Never  . Sexual activity: Not on file  Other Topics Concern  . Not on file  Social History Narrative  . Not on file   Social Determinants of Health      Financial   Resource Strain:   . Difficulty of Paying Living Expenses:   Food Insecurity:   . Worried About Charity fundraiser in the Last Year:   . Arboriculturist in the Last Year:   Transportation Needs:   . Film/video editor (Medical):   Marland Kitchen Lack of Transportation (Non-Medical):       PHYSICAL EXAM:    Vitals:   02/27/20 0941  BP: 134/82  Pulse: 62   Body mass index is 25.39 kg/m. Weight: 75.8 kg (167 lb)   GENERAL: Alert, active, oriented x3  HEENT: Pupils equal reactive to light. Extraocular movements are intact. Sclera clear. Palpebral conjunctiva normal red color.Pharynx clear.  NECK: Supple with no palpable mass and no adenopathy.  LUNGS: Sound clear with no  rales rhonchi or wheezes.  HEART: Regular rhythm S1 and S2 without murmur.  ABDOMEN: Soft and depressible, nontender with no palpable mass, no hepatomegaly.  Small reducible ventral hernia, mild tender to reduction.  EXTREMITIES: Well-developed well-nourished symmetrical with no dependent edema.  NEUROLOGICAL: Awake alert oriented, facial expression symmetrical, moving all extremities.  REVIEW OF DATA: I have reviewed the following data today:      Initial consult on 01/30/2020  Component Date Value  . WBC (White Blood Cell Co* 01/30/2020 12.4*  . RBC (Red Blood Cell Coun* 01/30/2020 4.71   . Hemoglobin 01/30/2020 14.9   . Hematocrit 01/30/2020 45.7   . MCV (Mean Corpuscular Vo* 01/30/2020 97.0   . MCH (Mean Corpuscular He* 01/30/2020 31.6*  . MCHC (Mean Corpuscular H* 01/30/2020 32.6   . Platelet Count 01/30/2020 282   . RDW-CV (Red Cell Distrib* 01/30/2020 13.8   . MPV (Mean Platelet Volum* 01/30/2020 10.5   . Neutrophils 01/30/2020 8.04*  . Lymphocytes 01/30/2020 3.17   . Monocytes 01/30/2020 1.00   . Eosinophils 01/30/2020 0.13   . Basophils 01/30/2020 0.05   . Neutrophil % 01/30/2020 64.8   . Lymphocyte % 01/30/2020 25.5   . Monocyte % 01/30/2020 8.1   . Eosinophil % 01/30/2020 1.0   . Basophil% 01/30/2020 0.4   . Immature Granulocyte % 01/30/2020 0.2   . Immature Granulocyte Cou* 01/30/2020 0.03   . Glucose 01/30/2020 103   . Sodium 01/30/2020 139   . Potassium 01/30/2020 4.4   . Chloride 01/30/2020 107   . Carbon Dioxide (CO2) 01/30/2020 27.4   . Urea Nitrogen (BUN) 01/30/2020 22   . Creatinine 01/30/2020 1.3   . Glomerular Filtration Ra* 01/30/2020 55*  . Calcium 01/30/2020 9.2   . AST  01/30/2020 14   . ALT  01/30/2020 14   . Alk Phos (alkaline Phosp* 01/30/2020 55   . Albumin 01/30/2020 4.3   . Bilirubin, Total 01/30/2020 1.0   . Protein, Total 01/30/2020 6.5   . A/G Ratio 01/30/2020 2.0      ASSESSMENT: Mr. Mcburney is a 69 y.o. male presenting  for consultation for ventral hernia.  Patient with physical exam and CT scan finding of ventral hernia.  I discussed with the patient the recommendation having surgical management due to the new symptoms of pain.  I discussed with the patient the different surgical approach including open versus robotic assisted laparoscopic ventral hernia repair.  I discussed with the patient the risk of surgery that includes: Pain, infection, bleeding, injury to intestine, injury to bladder, injury to vascular, bowel obstruction, among others.  Patient reported he understood and agreed to proceed.  Patient elected to proceed with robotic assisted laparoscopic ventral hernia repair.  Ventral hernia without obstruction or gangrene [K43.9]  PLAN: 1. Robotic assisted laparoscopic ventral hernia repair with mesh ([id number]) 2. CBC, CMP (done) 3. Hold aspirin 5 days before surgery 4. Contact us if you have any concern.   Patient and his wife verbalized understanding, all questions were answered, and were agreeable with the plan outlined above.   Herbert Pun, MD  Electronically signed by Herbert Pun, MD

## 2020-03-02 ENCOUNTER — Encounter
Admission: RE | Admit: 2020-03-02 | Discharge: 2020-03-02 | Disposition: A | Discharge status: Home / Self Care | Payer: Medicare Other | Source: Ambulatory Visit | Attending: General Surgery | Admitting: General Surgery

## 2020-03-02 ENCOUNTER — Other Ambulatory Visit: Payer: Self-pay

## 2020-03-02 DIAGNOSIS — Z01812 Encounter for preprocedural laboratory examination: Secondary | ICD-10-CM | POA: Insufficient documentation

## 2020-03-02 NOTE — Patient Instructions (Signed)
Your procedure is scheduled on: Friday 7/23 Report to Day Surgery. To find out your arrival time please call [phone] between 1PM - 3PM on .Thurs 7/24  Remember: Instructions that are not followed completely may result in serious medical risk,  up to and including death, or upon the discretion of your surgeon and anesthesiologist your  surgery may need to be rescheduled.     _X__ 1. Do not eat food after midnight the night before your procedure.                 No gum chewing or hard candies. You may drink clear liquids up to 2 hours                 before you are scheduled to arrive for your surgery- DO not drink clear                 liquids within 2 hours of the start of your surgery.                 Clear Liquids include:  water, apple juice without pulp, clear Gatorade, G2 or                  Gatorade Zero (avoid Red/Purple/Blue), Black Coffee or Tea (Do not add                 anything to coffee or tea). _____2.   Complete the carbohydrate drink provided to you, 2 hours before arrival.  __X__2.  On the morning of surgery brush your teeth with toothpaste and water, you                may rinse your mouth with mouthwash if you wish.  Do not swallow any toothpaste of mouthwash.     _X__ 3.  No Alcohol for 24 hours before or after surgery.   _X__ 4.  Do Not Smoke or use e-cigarettes For 24 Hours Prior to Your Surgery.                 Do not use any chewable tobacco products for at least 6 hours prior to                 Surgery.  ___  5.  Do not use any recreational drugs (marijuana, cocaine, heroin, ecstacy, MDMA or other)                For at least one week prior to your surgery.  Combination of these drugs with anesthesia                May have life threatening results.  ____  6.  Bring all medications with you on the day of surgery if instructed.   __x__  7.  Notify your doctor if there is any change in your medical condition      (cold, fever,  infections).     Do not wear jewelry, Do not wear lotions,  Do not shave 48 hours prior to surgery. Men may shave face and neck. Do not bring valuables to the hospital.    [facility] is not responsible for any belongings or valuables.  Contacts, dentures or bridgework may not be worn into surgery. Leave your suitcase in the car. After surgery it may be brought to your room. For patients admitted to the hospital, discharge time is determined by your treatment team.   Patients discharged the day of surgery will not be  allowed to drive home.   Make arrangements for someone to be with you for the first 24 hours of your Same Day Discharge.    Please read over the following fact sheets that you were given:     __x__ Take these medicines the morning of surgery with A SIP OF WATER:    1. amLODipine (NORVASC) 5 MG tablet  2. pantoprazole (PROTONIX) 40 MG tablet dose the night before and the morning of surgery  3.   4.  5.  6.  ____ Fleet Enema (as directed)   _x___ Use CHG Soap (or wipes) as directed  ____ Use Benzoyl Peroxide Gel as instructed  ____ Use inhalers on the day of surgery  ____ Stop metformin 2 days prior to surgery    ____ Take 1/2 of usual insulin dose the night before surgery. No insulin the morning          of surgery.   _x___ Stop aspirin today  __x__ Stop Anti-inflammatories today  ibuprofen (ADVIL) 200 MG tablet, Aleve or aspirin   May take tylenol   ____ Stop supplements until after surgery.    ____ Bring C-Pap to the hospital.

## 2020-03-04 ENCOUNTER — Other Ambulatory Visit: Payer: Self-pay

## 2020-03-04 ENCOUNTER — Other Ambulatory Visit
Admission: RE | Admit: 2020-03-04 | Discharge: 2020-03-04 | Disposition: A | Discharge status: Home / Self Care | Payer: Medicare Other | Source: Ambulatory Visit | Attending: General Surgery | Admitting: General Surgery

## 2020-03-04 DIAGNOSIS — Z01812 Encounter for preprocedural laboratory examination: Secondary | ICD-10-CM | POA: Insufficient documentation

## 2020-03-04 DIAGNOSIS — Z20822 Contact with and (suspected) exposure to covid-19: Secondary | ICD-10-CM | POA: Diagnosis not present

## 2020-03-04 LAB — SARS CORONAVIRUS 2 (TAT 6-24 HRS): SARS Coronavirus 2: NEGATIVE

## 2020-03-06 ENCOUNTER — Other Ambulatory Visit: Payer: Self-pay

## 2020-03-06 ENCOUNTER — Encounter: Payer: Self-pay | Admitting: General Surgery

## 2020-03-06 ENCOUNTER — Ambulatory Visit: Payer: Medicare Other | Admitting: Certified Registered"

## 2020-03-06 ENCOUNTER — Ambulatory Visit
Admission: RE | Admit: 2020-03-06 | Discharge: 2020-03-06 | Disposition: A | Discharge status: Home / Self Care | Payer: Medicare Other | Source: Ambulatory Visit | Attending: General Surgery | Admitting: General Surgery

## 2020-03-06 ENCOUNTER — Encounter
Admission: RE | Disposition: A | Discharge status: Home / Self Care | Payer: Self-pay | Source: Ambulatory Visit | Attending: General Surgery

## 2020-03-06 DIAGNOSIS — Z79899 Other long term (current) drug therapy: Secondary | ICD-10-CM | POA: Diagnosis not present

## 2020-03-06 DIAGNOSIS — Z86010 Personal history of colonic polyps: Secondary | ICD-10-CM | POA: Diagnosis not present

## 2020-03-06 DIAGNOSIS — F17200 Nicotine dependence, unspecified, uncomplicated: Secondary | ICD-10-CM | POA: Diagnosis not present

## 2020-03-06 DIAGNOSIS — Z820 Family history of epilepsy and other diseases of the nervous system: Secondary | ICD-10-CM | POA: Diagnosis not present

## 2020-03-06 DIAGNOSIS — I10 Essential (primary) hypertension: Secondary | ICD-10-CM | POA: Insufficient documentation

## 2020-03-06 DIAGNOSIS — K219 Gastro-esophageal reflux disease without esophagitis: Secondary | ICD-10-CM | POA: Diagnosis not present

## 2020-03-06 DIAGNOSIS — J449 Chronic obstructive pulmonary disease, unspecified: Secondary | ICD-10-CM | POA: Diagnosis not present

## 2020-03-06 DIAGNOSIS — K439 Ventral hernia without obstruction or gangrene: Secondary | ICD-10-CM | POA: Insufficient documentation

## 2020-03-06 DIAGNOSIS — Z7982 Long term (current) use of aspirin: Secondary | ICD-10-CM | POA: Insufficient documentation

## 2020-03-06 HISTORY — PX: XI ROBOTIC ASSISTED VENTRAL HERNIA: SHX6789

## 2020-03-06 SURGERY — REPAIR, HERNIA, VENTRAL, ROBOT-ASSISTED
Anesthesia: General | Site: Abdomen

## 2020-03-06 MED ORDER — GLYCOPYRROLATE 0.2 MG/ML IJ SOLN
INTRAMUSCULAR | Status: AC
  Filled 2020-03-06: qty 1

## 2020-03-06 MED ORDER — ROCURONIUM BROMIDE 10 MG/ML (PF) SYRINGE
PREFILLED_SYRINGE | INTRAVENOUS | Status: AC
  Filled 2020-03-06: qty 10

## 2020-03-06 MED ORDER — DEXAMETHASONE SODIUM PHOSPHATE 10 MG/ML IJ SOLN
INTRAMUSCULAR | Status: DC | PRN
  Administered 2020-03-06: 5 mg via INTRAVENOUS

## 2020-03-06 MED ORDER — CEFAZOLIN SODIUM-DEXTROSE 2-4 GM/100ML-% IV SOLN
INTRAVENOUS | Status: AC
  Filled 2020-03-06: qty 100

## 2020-03-06 MED ORDER — BUPIVACAINE LIPOSOME 1.3 % IJ SUSP
INTRAMUSCULAR | Status: DC | PRN
  Administered 2020-03-06: 20 mL

## 2020-03-06 MED ORDER — ONDANSETRON HCL 4 MG/2ML IJ SOLN
INTRAMUSCULAR | Status: DC | PRN
  Administered 2020-03-06: 4 mg via INTRAVENOUS

## 2020-03-06 MED ORDER — ACETAMINOPHEN 10 MG/ML IV SOLN
INTRAVENOUS | Status: AC
  Filled 2020-03-06: qty 100

## 2020-03-06 MED ORDER — GLYCOPYRROLATE 0.2 MG/ML IJ SOLN
INTRAMUSCULAR | Status: DC | PRN
  Administered 2020-03-06: .2 mg via INTRAVENOUS

## 2020-03-06 MED ORDER — PHENYLEPHRINE HCL (PRESSORS) 10 MG/ML IV SOLN
INTRAVENOUS | Status: AC
  Filled 2020-03-06: qty 1

## 2020-03-06 MED ORDER — EPHEDRINE SULFATE 50 MG/ML IJ SOLN
INTRAMUSCULAR | Status: DC | PRN
  Administered 2020-03-06: 10 mg via INTRAVENOUS

## 2020-03-06 MED ORDER — PROPOFOL 10 MG/ML IV BOLUS
INTRAVENOUS | Status: DC | PRN
  Administered 2020-03-06: 150 mg via INTRAVENOUS
  Administered 2020-03-06: 50 mg via INTRAVENOUS

## 2020-03-06 MED ORDER — HYDROCODONE-ACETAMINOPHEN 5-325 MG PO TABS
ORAL_TABLET | ORAL | Status: AC
  Administered 2020-03-06: 1 via ORAL
  Filled 2020-03-06: qty 1

## 2020-03-06 MED ORDER — MIDAZOLAM HCL 2 MG/2ML IJ SOLN
INTRAMUSCULAR | Status: DC | PRN
  Administered 2020-03-06: 2 mg via INTRAVENOUS

## 2020-03-06 MED ORDER — PHENYLEPHRINE HCL (PRESSORS) 10 MG/ML IV SOLN
INTRAVENOUS | Status: DC | PRN
  Administered 2020-03-06 (×2): 100 ug via INTRAVENOUS

## 2020-03-06 MED ORDER — LIDOCAINE HCL (PF) 2 % IJ SOLN
INTRAMUSCULAR | Status: AC
  Filled 2020-03-06: qty 5

## 2020-03-06 MED ORDER — ONDANSETRON HCL 4 MG/2ML IJ SOLN
INTRAMUSCULAR | Status: AC
  Filled 2020-03-06: qty 2

## 2020-03-06 MED ORDER — EPHEDRINE 5 MG/ML INJ
INTRAVENOUS | Status: AC
  Filled 2020-03-06: qty 10

## 2020-03-06 MED ORDER — ORAL CARE MOUTH RINSE: 15.0000 mL | Freq: Once | OROMUCOSAL | Status: AC

## 2020-03-06 MED ORDER — ACETAMINOPHEN 10 MG/ML IV SOLN
INTRAVENOUS | Status: DC | PRN
  Administered 2020-03-06: 1000 mg via INTRAVENOUS

## 2020-03-06 MED ORDER — DEXAMETHASONE SODIUM PHOSPHATE 10 MG/ML IJ SOLN
INTRAMUSCULAR | Status: AC
  Filled 2020-03-06: qty 1

## 2020-03-06 MED ORDER — PROPOFOL 10 MG/ML IV BOLUS
INTRAVENOUS | Status: AC
  Filled 2020-03-06: qty 20

## 2020-03-06 MED ORDER — SEVOFLURANE IN SOLN
RESPIRATORY_TRACT | Status: AC
  Filled 2020-03-06: qty 250

## 2020-03-06 MED ORDER — LIDOCAINE HCL (CARDIAC) PF 100 MG/5ML IV SOSY
PREFILLED_SYRINGE | INTRAVENOUS | Status: DC | PRN
  Administered 2020-03-06: 50 mg via INTRAVENOUS

## 2020-03-06 MED ORDER — SUGAMMADEX SODIUM 200 MG/2ML IV SOLN
INTRAVENOUS | Status: DC | PRN
  Administered 2020-03-06: 200 mg via INTRAVENOUS

## 2020-03-06 MED ORDER — CHLORHEXIDINE GLUCONATE 0.12 % MT SOLN
OROMUCOSAL | Status: AC
  Administered 2020-03-06: 15 mL via OROMUCOSAL
  Filled 2020-03-06: qty 15

## 2020-03-06 MED ORDER — CHLORHEXIDINE GLUCONATE 0.12 % MT SOLN: 15.0000 mL | Freq: Once | OROMUCOSAL | Status: AC

## 2020-03-06 MED ORDER — BUPIVACAINE-EPINEPHRINE (PF) 0.25% -1:200000 IJ SOLN
INTRAMUSCULAR | Status: DC | PRN
  Administered 2020-03-06: 10 mL
  Administered 2020-03-06: 20 mL

## 2020-03-06 MED ORDER — KETOROLAC TROMETHAMINE 30 MG/ML IJ SOLN
INTRAMUSCULAR | Status: DC | PRN
  Administered 2020-03-06: 30 mg via INTRAVENOUS

## 2020-03-06 MED ORDER — HYDROCODONE-ACETAMINOPHEN 5-325 MG PO TABS: 1.0000 | ORAL_TABLET | Freq: Once | ORAL | Status: AC

## 2020-03-06 MED ORDER — LACTATED RINGERS IV SOLN: INTRAVENOUS | Status: DC

## 2020-03-06 MED ORDER — FENTANYL CITRATE (PF) 100 MCG/2ML IJ SOLN
INTRAMUSCULAR | Status: AC
  Filled 2020-03-06: qty 2

## 2020-03-06 MED ORDER — CEFAZOLIN SODIUM-DEXTROSE 2-4 GM/100ML-% IV SOLN
2.0000 g | INTRAVENOUS | Status: AC
  Administered 2020-03-06: 2 g via INTRAVENOUS

## 2020-03-06 MED ORDER — HYDROCODONE-ACETAMINOPHEN 5-325 MG PO TABS: 1.0000 | ORAL_TABLET | ORAL | 0 refills | Status: AC | PRN

## 2020-03-06 MED ORDER — FENTANYL CITRATE (PF) 100 MCG/2ML IJ SOLN
INTRAMUSCULAR | Status: DC | PRN
  Administered 2020-03-06 (×2): 50 ug via INTRAVENOUS

## 2020-03-06 MED ORDER — ROCURONIUM BROMIDE 100 MG/10ML IV SOLN
INTRAVENOUS | Status: DC | PRN
  Administered 2020-03-06: 50 mg via INTRAVENOUS
  Administered 2020-03-06: 10 mg via INTRAVENOUS

## 2020-03-06 MED ORDER — MIDAZOLAM HCL 2 MG/2ML IJ SOLN
INTRAMUSCULAR | Status: AC
  Filled 2020-03-06: qty 2

## 2020-03-06 SURGICAL SUPPLY — 46 items
ADH SKN CLS APL DERMABOND .7 (GAUZE/BANDAGES/DRESSINGS) ×1
APL PRP STRL LF DISP 70% ISPRP (MISCELLANEOUS) ×1
BLADE SURG SZ11 CARB STEEL (BLADE) ×3 IMPLANT
CANISTER SUCT 1200ML W/VALVE (MISCELLANEOUS) ×3 IMPLANT
CHLORAPREP W/TINT 26 (MISCELLANEOUS) ×3 IMPLANT
COVER TIP SHEARS 8 DVNC (MISCELLANEOUS) ×1 IMPLANT
COVER TIP SHEARS 8MM DA VINCI (MISCELLANEOUS) ×2
COVER WAND RF STERILE (DRAPES) ×6 IMPLANT
DEFOGGER SCOPE WARMER CLEARIFY (MISCELLANEOUS) ×3 IMPLANT
DERMABOND ADVANCED (GAUZE/BANDAGES/DRESSINGS) ×2
DERMABOND ADVANCED .7 DNX12 (GAUZE/BANDAGES/DRESSINGS) ×1 IMPLANT
DRAPE ARM DVNC X/XI (DISPOSABLE) ×3 IMPLANT
DRAPE COLUMN DVNC XI (DISPOSABLE) ×1 IMPLANT
DRAPE DA VINCI XI ARM (DISPOSABLE) ×6
DRAPE DA VINCI XI COLUMN (DISPOSABLE) ×2
ELECT REM PT RETURN 9FT ADLT (ELECTROSURGICAL) ×3
ELECTRODE REM PT RTRN 9FT ADLT (ELECTROSURGICAL) ×1 IMPLANT
GLOVE BIO SURGEON STRL SZ 6.5 (GLOVE) ×4 IMPLANT
GLOVE BIO SURGEONS STRL SZ 6.5 (GLOVE) ×2
GLOVE BIOGEL PI IND STRL 6.5 (GLOVE) ×2 IMPLANT
GLOVE BIOGEL PI INDICATOR 6.5 (GLOVE) ×4
GOWN STRL REUS W/ TWL LRG LVL3 (GOWN DISPOSABLE) ×3 IMPLANT
GOWN STRL REUS W/TWL LRG LVL3 (GOWN DISPOSABLE) ×9
IRRIGATOR SUCT 8 DISP DVNC XI (IRRIGATION / IRRIGATOR) IMPLANT
IRRIGATOR SUCTION 8MM XI DISP (IRRIGATION / IRRIGATOR)
IV NS 1000ML (IV SOLUTION)
IV NS 1000ML BAXH (IV SOLUTION) IMPLANT
KIT PINK PAD W/HEAD ARE REST (MISCELLANEOUS) ×3
KIT PINK PAD W/HEAD ARM REST (MISCELLANEOUS) ×1 IMPLANT
LABEL OR SOLS (LABEL) ×3 IMPLANT
MESH VENT LT ST 11.4CM CRL (Mesh General) ×3 IMPLANT
NEEDLE HYPO 22GX1.5 SAFETY (NEEDLE) ×3 IMPLANT
NEEDLE INSUFFLATION 14GA 120MM (NEEDLE) ×3 IMPLANT
OBTURATOR OPTICAL STANDARD 8MM (TROCAR) ×2
OBTURATOR OPTICAL STND 8 DVNC (TROCAR) ×1
OBTURATOR OPTICALSTD 8 DVNC (TROCAR) ×1 IMPLANT
PACK LAP CHOLECYSTECTOMY (MISCELLANEOUS) ×3 IMPLANT
SEAL CANN UNIV 5-8 DVNC XI (MISCELLANEOUS) ×3 IMPLANT
SEAL XI 5MM-8MM UNIVERSAL (MISCELLANEOUS) ×6
SET TUBE SMOKE EVAC HIGH FLOW (TUBING) ×3 IMPLANT
SOLUTION ELECTROLUBE (MISCELLANEOUS) ×3 IMPLANT
SUT MNCRL AB 4-0 PS2 18 (SUTURE) ×3 IMPLANT
SUT STRATAFIX PDS 30 CT-1 (SUTURE) ×3 IMPLANT
SUT VLOC 90 2/L VL 12 GS22 (SUTURE) ×6 IMPLANT
TAPE TRANSPORE STRL 2 31045 (GAUZE/BANDAGES/DRESSINGS) IMPLANT
TRAY FOLEY MTR SLVR 16FR STAT (SET/KITS/TRAYS/PACK) IMPLANT

## 2020-03-06 NOTE — Interval H&P Note (Signed)
History and Physical Interval Note:  03/06/2020 9:06 AM  Adam Hurst  has presented today for surgery, with the diagnosis of K43.9 Ventral hernia w/o obstruction or gangrene.  The various methods of treatment have been discussed with the patient and family. After consideration of risks, benefits and other options for treatment, the patient has consented to  Procedure(s): XI ROBOTIC [place] (N/A) as a surgical intervention.  The patient's history has been reviewed, patient examined, no change in status, stable for surgery.  I have reviewed the patient's chart and labs.  Questions were answered to the patient's satisfaction.     Herbert Pun

## 2020-03-06 NOTE — Anesthesia Postprocedure Evaluation (Signed)
Anesthesia Post Note  Patient: Adam Hurst  Procedure(s) Performed: XI ROBOTIC ASSISTED VENTRAL HERNIA (N/A Abdomen)  Patient location during evaluation: PACU Anesthesia Type: General Level of consciousness: awake and alert Pain management: pain level controlled Vital Signs Assessment: post-procedure vital signs reviewed and stable Respiratory status: spontaneous breathing and respiratory function stable Cardiovascular status: stable Anesthetic complications: no   No complications documented.   Last Vitals:  Vitals:   03/06/20 1119 03/06/20 1120  BP: (!) 130/74   Pulse: 73 69  Resp: 20 21  Temp: (!) 36 C   SpO2: 100% 100%    Last Pain:  Vitals:   03/06/20 0818  TempSrc: Temporal  PainSc: 0-No pain                 Maryfrances Portugal K

## 2020-03-06 NOTE — Discharge Instructions (Signed)
°  Diet: Resume home heart healthy regular diet.   Activity: No heavy lifting >20 pounds (children, pets, laundry, garbage) or strenuous activity until follow-up, but light activity and walking are encouraged. Do not drive or drink alcohol if taking narcotic pain medications.  Wound care: May shower with soapy water and pat dry (do not rub incisions), but no baths or submerging incision underwater until follow-up. (no swimming)   Medications: Resume all home medications. For mild to moderate pain: acetaminophen (Tylenol) or ibuprofen (if no kidney disease). Combining Tylenol with alcohol can substantially increase your risk of causing liver disease. Narcotic pain medications, if prescribed, can be used for severe pain, though may cause nausea, constipation, and drowsiness. Do not combine Tylenol and Norco within a 6 hour period as Norco contains Tylenol. If you do not need the narcotic pain medication, you do not need to fill the prescription.   AMBULATORY SURGERY  DISCHARGE INSTRUCTIONS   1) The drugs that you were given will stay in your system until tomorrow so for the next 24 hours you should not:  A) Drive an automobile B) Make any legal decisions C) Drink any alcoholic beverage   2) You may resume regular meals tomorrow.  Today it is better to start with liquids and gradually work up to solid foods.  You may eat anything you prefer, but it is better to start with liquids, then soup and crackers, and gradually work up to solid foods.   3) Please notify your doctor immediately if you have any unusual bleeding, trouble breathing, redness and pain at the surgery site, drainage, fever, or pain not relieved by medication.    4) Additional Instructions:        Please contact your physician with any problems or Same Day Surgery at [phone], Monday through Friday 6 am to 4 pm, or [facility] number at [phone]. Call office [phone]) at any time if any  questions, worsening pain, fevers/chills, bleeding, drainage from incision site, or other concerns.

## 2020-03-06 NOTE — Anesthesia Procedure Notes (Signed)
Procedure Name: Intubation Date/Time: 03/06/2020 9:36 AM Performed by: Lerry Liner, CRNA Pre-anesthesia Checklist: Patient identified, Emergency Drugs available, Suction available, Patient being monitored and Timeout performed Patient Re-evaluated:Patient Re-evaluated prior to induction Oxygen Delivery Method: Circle system utilized Preoxygenation: Pre-oxygenation with 100% oxygen Induction Type: IV induction Ventilation: Mask ventilation without difficulty Laryngoscope Size: McGraph and 4 Grade View: Grade I Tube type: Oral Tube size: 7.5 mm Number of attempts: 1 Placement Confirmation: ETT inserted through vocal cords under direct vision,  positive ETCO2 and breath sounds checked- equal and bilateral Secured at: 22 cm Tube secured with: Tape Dental Injury: Teeth and Oropharynx as per pre-operative assessment

## 2020-03-06 NOTE — Anesthesia Preprocedure Evaluation (Signed)
Anesthesia Evaluation  Patient identified by MRN, date of birth, ID band Patient awake    Reviewed: Allergy & Precautions, NPO status , Patient's Chart, lab work & pertinent test results  History of Anesthesia Complications Negative for: history of anesthetic complications  Airway Mallampati: II       Dental   Pulmonary neg sleep apnea, COPD, Current Smoker,           Cardiovascular hypertension, Pt. on medications (-) Past MI and (-) CHF (-) dysrhythmias (-) Valvular Problems/Murmurs     Neuro/Psych neg Seizures    GI/Hepatic Neg liver ROS, GERD  Medicated and Controlled,  Endo/Other  neg diabetes  Renal/GU negative Renal ROS     Musculoskeletal   Abdominal   Peds  Hematology   Anesthesia Other Findings   Reproductive/Obstetrics                             Anesthesia Physical Anesthesia Plan  ASA: III  Anesthesia Plan: General   Post-op Pain Management:    Induction: Intravenous  PONV Risk Score and Plan: 1 and Ondansetron  Airway Management Planned: Oral ETT  Additional Equipment:   Intra-op Plan:   Post-operative Plan:   Informed Consent: I have reviewed the patients History and Physical, chart, labs and discussed the procedure including the risks, benefits and alternatives for the proposed anesthesia with the patient or authorized representative who has indicated his/her understanding and acceptance.       Plan Discussed with:   Anesthesia Plan Comments:         Anesthesia Quick Evaluation

## 2020-03-06 NOTE — Op Note (Signed)
Preoperative diagnosis: Epigastric Hernia  Postoperative diagnosis: Epigastric Hernia  Procedure: Robotic assisted laparoscopic epigastric hernia repair with mesh  Anesthesia: General  Surgeon: Dr. Windell Moment  Wound Classification: Clean  Specimen: None  Complications: None  Estimated Blood Loss: 57ml  Indications: A 69 year old male with symptomatic epigastric hernia.   Findings: 1. 3 cm x 2 cm epigastric hernia 4. Tension free repair achieved with 11.4 cm bard mesh and suture 5. Adequate hemostasis  Description of procedure: The patient was brought to the operating room and general anesthesia was induced. A time-out was completed verifying correct patient, procedure, site, positioning, and implant(s) and/or special equipment prior to beginning this procedure. Antibiotics were administered prior to making the incision. SCDs placed. The anterior abdominal wall was prepped and draped in the standard sterile fashion.   Palmer's point chosen for entry.  Veress needle placed and abdomen insufflated to 15cm without any dramatic increase in pressure.  Needle removed and optiview technique used to place 61mm port at the left lower quadrant.  No injury noted during placement. Exparel was infused in a TAP block. Two additional ports, 49mm x2 along the lower abdomen were placed.  Xi robot then docked into place.  Hernia contents noted and reduced with combination of blunt, sharp dissection with scissors and fenestrated forceps.  Hemostasis achieved throughout this portion.  Once all hernia contents reduced, there was noted to be a 3 cm x 2 cm hernia.    Insufflation dropped to 11mm and transfacial suture with 0 stratafix used to primarily close defect under minimal tension. Bard echo plus protected 11.4 mesh was placed within the abdominal cavity and secured to the abdominal wall centered over the defect using the 0 stratafix previously used to primarily close defect.  The mesh was then  circumferentially sutured into the anterior abdominal wall using 2-0 VLock x2.  Any bleeding noted during this portion was no longer actively bleeding by end of securing mesh and tightening the suture.    Robot was undocked.  Abdomen then desufflated while camera within abdomen to ensure no signs of new bleed prior to removing camera and rest of ports completely.  All skin incisions closed with runninrg 4-0 Monocryl in a subcuticular fashion.  All wounds then dressed with Dermabond.  Patient was then successfully awakened and transferred to PACU in stable condition.  At the end of the procedure sponge and instrument counts were correct.

## 2020-03-06 NOTE — Transfer of Care (Signed)
Immediate Anesthesia Transfer of Care Note  Patient: [name]  Procedure(s) Performed: XI ROBOTIC ASSISTED VENTRAL HERNIA (N/A Abdomen)  Patient Location: PACU  Anesthesia Type:General  Level of Consciousness: drowsy  Airway & Oxygen Therapy: Patient Spontanous Breathing and Patient connected to nasal cannula oxygen  Post-op Assessment: Report given to RN  Post vital signs: stable  Last Vitals:  Vitals Value Taken Time  BP    Temp    Pulse 72 03/06/20 1118  Resp 18 03/06/20 1118  SpO2 100 % 03/06/20 1118  Vitals shown include unvalidated device data.  Last Pain:  Vitals:   03/06/20 0818  TempSrc: Temporal  PainSc: 0-No pain         Complications: No complications documented.

## 2020-03-09 ENCOUNTER — Encounter: Payer: Self-pay | Admitting: General Surgery

## 2020-06-04 ENCOUNTER — Other Ambulatory Visit: Payer: Self-pay

## 2020-06-04 ENCOUNTER — Ambulatory Visit (INDEPENDENT_AMBULATORY_CARE_PROVIDER_SITE_OTHER): Payer: Medicare Other

## 2020-06-04 DIAGNOSIS — Z23 Encounter for immunization: Secondary | ICD-10-CM

## 2020-07-07 ENCOUNTER — Encounter: Payer: Self-pay | Admitting: Nurse Practitioner

## 2020-07-07 ENCOUNTER — Ambulatory Visit (INDEPENDENT_AMBULATORY_CARE_PROVIDER_SITE_OTHER): Payer: Medicare Other | Admitting: Nurse Practitioner

## 2020-07-07 ENCOUNTER — Other Ambulatory Visit: Payer: Self-pay

## 2020-07-07 VITALS — BP 143/78 | HR 99 | Temp 98.1°F | Resp 16 | Ht 68.0 in | Wt 171.0 lb

## 2020-07-07 DIAGNOSIS — R300 Dysuria: Secondary | ICD-10-CM | POA: Diagnosis not present

## 2020-07-07 DIAGNOSIS — R81 Glycosuria: Secondary | ICD-10-CM

## 2020-07-07 DIAGNOSIS — M545 Low back pain, unspecified: Secondary | ICD-10-CM

## 2020-07-07 DIAGNOSIS — I10 Essential (primary) hypertension: Secondary | ICD-10-CM | POA: Diagnosis not present

## 2020-07-07 DIAGNOSIS — K219 Gastro-esophageal reflux disease without esophagitis: Secondary | ICD-10-CM

## 2020-07-07 DIAGNOSIS — Z0001 Encounter for general adult medical examination with abnormal findings: Secondary | ICD-10-CM | POA: Diagnosis not present

## 2020-07-07 LAB — POCT URINALYSIS DIPSTICK
Bilirubin, UA: NEGATIVE
Blood, UA: NEGATIVE
Glucose, UA: POSITIVE — AB
Ketones, UA: NEGATIVE
Leukocytes, UA: NEGATIVE
Nitrite, UA: NEGATIVE
Protein, UA: NEGATIVE
Spec Grav, UA: 1.015 (ref 1.010–1.025)
Urobilinogen, UA: 0.2 E.U./dL
pH, UA: 5 (ref 5.0–8.0)

## 2020-07-07 NOTE — Progress Notes (Signed)
[facility] [place], [place] [address]  Internal MEDICINE  Office Visit Note  Patient Name: Adam Hurst  [id number]  [id number]  Date of Service: 08/01/2020   Pt is here for routine health maintenance examination  Chief Complaint  Patient presents with  . Medicare Wellness  . Gastroesophageal Reflux  . Hypertension  . controlled substance form    reviewed with PT     The patient is here for health maintenance exam. Today, is states that he has some left sided low back pain. This is intermittent and worse with exertion. He generally will alternate treatment with ibuprofen and tylenol and apply heat on his back. Wants to know if there is anything else he can do to help the pain when it occurs. He denies injury or trauma to his back. He denies hematuria or dysuria. He denies nausea, vomiting, or abdominal pain. The patient denies any chest pain, chest pressure, headache, or shortness of breath. The patient is due to have routine, fasting labs done.     Current Medication: Outpatient Encounter Medications as of 07/07/2020  Medication Sig  . acetaminophen (TYLENOL) 500 MG tablet Take 500 mg by mouth every 6 (six) hours as needed for moderate pain or headache.  Marland Kitchen amLODipine (NORVASC) 5 MG tablet Take 1 tablet (5 mg total) by mouth daily.  Marland Kitchen aspirin EC 81 MG tablet Take 81 mg by mouth daily.  Marland Kitchen ibuprofen (ADVIL) 200 MG tablet Take 200 mg by mouth every 6 (six) hours as needed for moderate pain.  . pantoprazole (PROTONIX) 40 MG tablet Take 1 tablet (40 mg total) by mouth daily.   No facility-administered encounter medications on file as of 07/07/2020.    Surgical History: Past Surgical History:  Procedure Laterality Date  . COLONOSCOPY W/ POLYPECTOMY    . VASECTOMY Bilateral 1991  . XI ROBOTIC ASSISTED VENTRAL HERNIA N/A 03/06/2020   Procedure: XI ROBOTIC ASSISTED VENTRAL HERNIA;  Surgeon: Herbert Pun, MD;  Location: [facility] ORS;  Service:  General;  Laterality: N/A;    Medical History: Past Medical History:  Diagnosis Date  . GERD (gastroesophageal reflux disease)   . Hypertension     Family History: Family History  Problem Relation Age of Onset  . Alzheimer's disease Mother   . Cancer Father       Review of Systems  Constitutional: Negative for chills, fatigue and unexpected weight change.  HENT: Negative for congestion, postnasal drip, rhinorrhea, sneezing and sore throat.   Respiratory: Negative for cough, chest tightness, shortness of breath and wheezing.   Cardiovascular: Negative for chest pain and palpitations.       Blood pressure well managed.   Gastrointestinal: Negative for abdominal pain, constipation, diarrhea, nausea and vomiting.  Endocrine: Negative for cold intolerance, heat intolerance, polydipsia and polyuria.  Genitourinary: Negative for dysuria, frequency, hematuria and urgency.  Musculoskeletal: Positive for back pain and myalgias. Negative for arthralgias, joint swelling and neck pain.  Skin: Negative for rash.  Allergic/Immunologic: Negative for environmental allergies.  Neurological: Negative for dizziness, tremors, numbness and headaches.  Hematological: Negative for adenopathy. Does not bruise/bleed easily.  Psychiatric/Behavioral: Negative for behavioral problems (Depression), sleep disturbance and suicidal ideas. The patient is not nervous/anxious.     Today's Vitals   07/07/20 0911  BP: (!) 143/78  Pulse: 99  Resp: 16  Temp: 98.1 F (36.7 C)  SpO2: 99%  Weight: 171 lb (77.6 kg)  Height: 5\' 8"  (1.727 m)   Body mass index is 26  kg/m.  Physical Exam Vitals and nursing note reviewed.  Constitutional:      General: He is not in acute distress.    Appearance: Normal appearance. He is well-developed and well-nourished. He is not diaphoretic.  HENT:     Head: Normocephalic and atraumatic.     Nose: Nose normal.     Mouth/Throat:     Mouth: Oropharynx is clear and moist.      Pharynx: No oropharyngeal exudate.  Eyes:     Extraocular Movements: EOM normal.     Pupils: Pupils are equal, round, and reactive to light.  Neck:     Thyroid: No thyromegaly.     Vascular: No carotid bruit or JVD.     Trachea: No tracheal deviation.  Cardiovascular:     Rate and Rhythm: Normal rate and regular rhythm.     Pulses: Normal pulses.     Heart sounds: Normal heart sounds. No murmur heard. No friction rub. No gallop.   Pulmonary:     Effort: Pulmonary effort is normal. No respiratory distress.     Breath sounds: Normal breath sounds. No wheezing or rales.  Chest:     Chest wall: No tenderness.  Abdominal:     General: Bowel sounds are normal.     Palpations: Abdomen is soft.     Tenderness: There is no abdominal tenderness.  Genitourinary:    Comments: Urine sample positive for glucose only.  Musculoskeletal:        General: Normal range of motion.     Cervical back: Normal range of motion and neck supple.     Comments: There is mild/moderate lower back pain. This is worse with bending and twisting at the waist. There are no bony abnormalities or deformities noted.   Lymphadenopathy:     Cervical: No cervical adenopathy.  Skin:    General: Skin is warm and dry.  Neurological:     General: No focal deficit present.     Mental Status: He is alert and oriented to person, place, and time.     Cranial Nerves: No cranial nerve deficit.  Psychiatric:        Mood and Affect: Mood and affect and mood normal.        Behavior: Behavior normal.        Thought Content: Thought content normal.        Judgment: Judgment normal.    Depression screen [facility] 2/9 07/07/2020 01/29/2020 10/29/2019 07/01/2019 07/01/2019  Decreased Interest 0 0 0 0 0  Down, Depressed, Hopeless 0 0 0 0 0  PHQ - 2 Score 0 0 0 0 0    Functional Status Survey: Is the patient deaf or have difficulty hearing?: Yes Does the patient have difficulty seeing, even when wearing glasses/contacts?: No Does  the patient have difficulty concentrating, remembering, or making decisions?: No Does the patient have difficulty walking or climbing stairs?: No Does the patient have difficulty dressing or bathing?: No Does the patient have difficulty doing errands alone such as visiting a doctor's office or shopping?: No  MMSE - [place] Exam 07/07/2020 07/01/2019 06/26/2018  Orientation to time 5 5 5   Orientation to Place 5 5 5   Registration 3 3 3   Attention/ Calculation 5 5 5   Recall 3 3 3   Language- name 2 objects 2 2 2   Language- repeat 1 1 1   Language- follow 3 step command 3 3 3   Language- read & follow direction 1 0 1  Write a sentence  1 0 1  Copy design 1 1 1   Total score 30 28 30     Fall Risk  07/07/2020 01/29/2020 10/29/2019 07/01/2019 07/01/2019  Falls in the past year? 0 0 0 0 0      LABS: Recent Results (from the past 2160 hour(s))  POCT Urinalysis Dipstick     Status: Abnormal   Collection Time: 07/07/20  9:56 AM  Result Value Ref Range   Color, UA     Clarity, UA     Glucose, UA Positive (A) Negative   Bilirubin, UA negative    Ketones, UA negative    Spec Grav, UA 1.015 1.010 - 1.025   Blood, UA negative    pH, UA 5.0 5.0 - 8.0   Protein, UA Negative Negative   Urobilinogen, UA 0.2 0.2 or 1.0 E.U./dL   Nitrite, UA negative    Leukocytes, UA Negative Negative   Appearance     Odor      Assessment/Plan: 1. Encounter for general adult medical examination with abnormal findings Annual health maintenance exam. Order slip given to have routine, fasting labs checked.   2. Essential hypertension Stable. Continue bp medication as prescribed.   3. Acute bilateral low back pain without sciatica Recommend he continue to alternate ibuprofen and tylenol to treat pain. Encouraged him to apply heat to the lower back when needed to help relieve tight and sore muscles.  4. Gastroesophageal reflux disease without esophagitis Continue pantoprazole as prescribed   5.  Glucosuria Urine sample positive for glucose today. Check HgbA1c along with routine, fasting labs. Treat as indicated.   6. Dysuria - POCT Urinalysis Dipstick positive for glucose.  No evidence of infection or other abnormalities.   General Counseling: Adam Hurst verbalizes understanding of the findings of todays visit and agrees with plan of treatment. I have discussed any further diagnostic evaluation that may be needed or ordered today. We also reviewed his medications today. he has been encouraged to call the office with any questions or concerns that should arise related to todays visit.    Counseling:  This patient was seen by Leretha Pol FNP Collaboration with Dr Lavera Guise as a part of collaborative care agreement  Orders Placed This Encounter  Procedures  . POCT Urinalysis Dipstick     Total time spent: 74 Minutes  Time spent includes review of chart, medications, test results, and follow up plan with the patient.     Lavera Guise, MD  Internal Medicine

## 2020-07-07 NOTE — Patient Instructions (Signed)
Low Back Sprain or Strain Rehab Ask your health care provider which exercises are safe for you. Do exercises exactly as told by your health care provider and adjust them as directed. It is normal to feel mild stretching, pulling, tightness, or discomfort as you do these exercises. Stop right away if you feel sudden pain or your pain gets worse. Do not begin these exercises until told by your health care provider. Stretching and range-of-motion exercises These exercises warm up your muscles and joints and improve the movement and flexibility of your back. These exercises also help to relieve pain, numbness, and tingling. Lumbar rotation  1. Lie on your back on a firm surface and bend your knees. 2. Straighten your arms out to your sides so each arm forms a 90-degree angle (right angle) with a side of your body. 3. Slowly move (rotate) both of your knees to one side of your body until you feel a stretch in your lower back (lumbar). Try not to let your shoulders lift off the floor. 4. Hold this position for __________ seconds. 5. Tense your abdominal muscles and slowly move your knees back to the starting position. 6. Repeat this exercise on the other side of your body. Repeat __________ times. Complete this exercise __________ times a day. Single knee to chest  1. Lie on your back on a firm surface with both legs straight. 2. Bend one of your knees. Use your hands to move your knee up toward your chest until you feel a gentle stretch in your lower back and buttock. ? Hold your leg in this position by holding on to the front of your knee. ? Keep your other leg as straight as possible. 3. Hold this position for __________ seconds. 4. Slowly return to the starting position. 5. Repeat with your other leg. Repeat __________ times. Complete this exercise __________ times a day. Prone extension on elbows  1. Lie on your abdomen on a firm surface (prone position). 2. Prop yourself up on your  elbows. 3. Use your arms to help lift your chest up until you feel a gentle stretch in your abdomen and your lower back. ? This will place some of your body weight on your elbows. If this is uncomfortable, try stacking pillows under your chest. ? Your hips should stay down, against the surface that you are lying on. Keep your hip and back muscles relaxed. 4. Hold this position for __________ seconds. 5. Slowly relax your upper body and return to the starting position. Repeat __________ times. Complete this exercise __________ times a day. Strengthening exercises These exercises build strength and endurance in your back. Endurance is the ability to use your muscles for a long time, even after they get tired. Pelvic tilt This exercise strengthens the muscles that lie deep in the abdomen. 1. Lie on your back on a firm surface. Bend your knees and keep your feet flat on the floor. 2. Tense your abdominal muscles. Tip your pelvis up toward the ceiling and flatten your lower back into the floor. ? To help with this exercise, you may place a small towel under your lower back and try to push your back into the towel. 3. Hold this position for __________ seconds. 4. Let your muscles relax completely before you repeat this exercise. Repeat __________ times. Complete this exercise __________ times a day. Alternating arm and leg raises  1. Get on your hands and knees on a firm surface. If you are on a hard floor, you  may want to use padding, such as an exercise mat, to cushion your knees. 2. Line up your arms and legs. Your hands should be directly below your shoulders, and your knees should be directly below your hips. 3. Lift your left leg behind you. At the same time, raise your right arm and straighten it in front of you. ? Do not lift your leg higher than your hip. ? Do not lift your arm higher than your shoulder. ? Keep your abdominal and back muscles tight. ? Keep your hips facing the  ground. ? Do not arch your back. ? Keep your balance carefully, and do not hold your breath. 4. Hold this position for __________ seconds. 5. Slowly return to the starting position. 6. Repeat with your right leg and your left arm. Repeat __________ times. Complete this exercise __________ times a day. Abdominal set with straight leg raise  1. Lie on your back on a firm surface. 2. Bend one of your knees and keep your other leg straight. 3. Tense your abdominal muscles and lift your straight leg up, 4-6 inches (10-15 cm) off the ground. 4. Keep your abdominal muscles tight and hold this position for __________ seconds. ? Do not hold your breath. ? Do not arch your back. Keep it flat against the ground. 5. Keep your abdominal muscles tense as you slowly lower your leg back to the starting position. 6. Repeat with your other leg. Repeat __________ times. Complete this exercise __________ times a day. Single leg lower with bent knees 1. Lie on your back on a firm surface. 2. Tense your abdominal muscles and lift your feet off the floor, one foot at a time, so your knees and hips are bent in 90-degree angles (right angles). ? Your knees should be over your hips and your lower legs should be parallel to the floor. 3. Keeping your abdominal muscles tense and your knee bent, slowly lower one of your legs so your toe touches the ground. 4. Lift your leg back up to return to the starting position. ? Do not hold your breath. ? Do not let your back arch. Keep your back flat against the ground. 5. Repeat with your other leg. Repeat __________ times. Complete this exercise __________ times a day. Posture and body mechanics Good posture and healthy body mechanics can help to relieve stress in your body's tissues and joints. Body mechanics refers to the movements and positions of your body while you do your daily activities. Posture is part of body mechanics. Good posture means:  Your spine is in its  natural S-curve position (neutral).  Your shoulders are pulled back slightly.  Your head is not tipped forward. Follow these guidelines to improve your posture and body mechanics in your everyday activities. Standing   When standing, keep your spine neutral and your feet about hip width apart. Keep a slight bend in your knees. Your ears, shoulders, and hips should line up.  When you do a task in which you stand in one place for a long time, place one foot up on a stable object that is 2-4 inches (5-10 cm) high, such as a footstool. This helps keep your spine neutral. Sitting   When sitting, keep your spine neutral and keep your feet flat on the floor. Use a footrest, if necessary, and keep your thighs parallel to the floor. Avoid rounding your shoulders, and avoid tilting your head forward.  When working at a desk or a Teaching laboratory technician, keep your desk  at a height where your hands are slightly lower than your elbows. Slide your chair under your desk so you are close enough to maintain good posture.  When working at a computer, place your monitor at a height where you are looking straight ahead and you do not have to tilt your head forward or downward to look at the screen. Resting  When lying down and resting, avoid positions that are most painful for you.  If you have pain with activities such as sitting, bending, stooping, or squatting, lie in a position in which your body does not bend very much. For example, avoid curling up on your side with your arms and knees near your chest (fetal position).  If you have pain with activities such as standing for a long time or reaching with your arms, lie with your spine in a neutral position and bend your knees slightly. Try the following positions: ? Lying on your side with a pillow between your knees. ? Lying on your back with a pillow under your knees. Lifting   When lifting objects, keep your feet at least shoulder width apart and tighten your  abdominal muscles.  Bend your knees and hips and keep your spine neutral. It is important to lift using the strength of your legs, not your back. Do not lock your knees straight out.  Always ask for help to lift heavy or awkward objects. This information is not intended to replace advice given to you by your health care provider. Make sure you discuss any questions you have with your health care provider. Document Revised: 11/23/2018 Document Reviewed: 08/23/2018 Elsevier Patient Education  [place].  Sciatica Rehab Ask your health care provider which exercises are safe for you. Do exercises exactly as told by your health care provider and adjust them as directed. It is normal to feel mild stretching, pulling, tightness, or discomfort as you do these exercises. Stop right away if you feel sudden pain or your pain gets worse. Do not begin these exercises until told by your health care provider. Stretching and range-of-motion exercises These exercises warm up your muscles and joints and improve the movement and flexibility of your hips and back. These exercises also help to relieve pain, numbness, and tingling. Sciatic nerve glide 7. Sit in a chair with your head facing down toward your chest. Place your hands behind your back. Let your shoulders slump forward. 8. Slowly straighten one of your legs while you tilt your head back as if you are looking toward the ceiling. Only straighten your leg as far as you can without making your symptoms worse. 9. Hold this position for __________ seconds. 10. Slowly return to the starting position. 11. Repeat with your other leg. Repeat __________ times. Complete this exercise __________ times a day. Knee to chest with hip adduction and internal rotation  6. Lie on your back on a firm surface with both legs straight. 7. Bend one of your knees and move it up toward your chest until you feel a gentle stretch in your lower back and buttock. Then, move  your knee toward the shoulder that is on the opposite side from your leg. This is hip adduction and internal rotation. ? Hold your leg in this position by holding on to the front of your knee. 8. Hold this position for __________ seconds. 9. Slowly return to the starting position. 10. Repeat with your other leg. Repeat __________ times. Complete this exercise __________ times a day. Prone extension  on elbows  6. Lie on your abdomen on a firm surface. A bed may be too soft for this exercise. 7. Prop yourself up on your elbows. 8. Use your arms to help lift your chest up until you feel a gentle stretch in your abdomen and your lower back. ? This will place some of your body weight on your elbows. If this is uncomfortable, try stacking pillows under your chest. ? Your hips should stay down, against the surface that you are lying on. Keep your hip and back muscles relaxed. 9. Hold this position for __________ seconds. 10. Slowly relax your upper body and return to the starting position. Repeat __________ times. Complete this exercise __________ times a day. Strengthening exercises These exercises build strength and endurance in your back. Endurance is the ability to use your muscles for a long time, even after they get tired. Pelvic tilt This exercise strengthens the muscles that lie deep in the abdomen. 5. Lie on your back on a firm surface. Bend your knees and keep your feet flat on the floor. 6. Tense your abdominal muscles. Tip your pelvis up toward the ceiling and flatten your lower back into the floor. ? To help with this exercise, you may place a small towel under your lower back and try to push your back into the towel. 7. Hold this position for __________ seconds. 8. Let your muscles relax completely before you repeat this exercise. Repeat __________ times. Complete this exercise __________ times a day. Alternating arm and leg raises  7. Get on your hands and knees on a firm surface.  If you are on a hard floor, you may want to use padding, such as an exercise mat, to cushion your knees. 8. Line up your arms and legs. Your hands should be directly below your shoulders, and your knees should be directly below your hips. 9. Lift your left leg behind you. At the same time, raise your right arm and straighten it in front of you. ? Do not lift your leg higher than your hip. ? Do not lift your arm higher than your shoulder. ? Keep your abdominal and back muscles tight. ? Keep your hips facing the ground. ? Do not arch your back. ? Keep your balance carefully, and do not hold your breath. 10. Hold this position for __________ seconds. 11. Slowly return to the starting position. 12. Repeat with your right leg and your left arm. Repeat __________ times. Complete this exercise __________ times a day. Posture and body mechanics Good posture and healthy body mechanics can help to relieve stress in your body's tissues and joints. Body mechanics refers to the movements and positions of your body while you do your daily activities. Posture is part of body mechanics. Good posture means:  Your spine is in its natural S-curve position (neutral).  Your shoulders are pulled back slightly.  Your head is not tipped forward. Follow these guidelines to improve your posture and body mechanics in your everyday activities. Standing   When standing, keep your spine neutral and your feet about hip width apart. Keep a slight bend in your knees. Your ears, shoulders, and hips should line up.  When you do a task in which you stand in one place for a long time, place one foot up on a stable object that is 2-4 inches (5-10 cm) high, such as a footstool. This helps keep your spine neutral. Sitting   When sitting, keep your spine neutral and keep your feet  flat on the floor. Use a footrest, if necessary, and keep your thighs parallel to the floor. Avoid rounding your shoulders, and avoid tilting your  head forward.  When working at a desk or a computer, keep your desk at a height where your hands are slightly lower than your elbows. Slide your chair under your desk so you are close enough to maintain good posture.  When working at a computer, place your monitor at a height where you are looking straight ahead and you do not have to tilt your head forward or downward to look at the screen. Resting  When lying down and resting, avoid positions that are most painful for you.  If you have pain with activities such as sitting, bending, stooping, or squatting, lie in a position in which your body does not bend very much. For example, avoid curling up on your side with your arms and knees near your chest (fetal position).  If you have pain with activities such as standing for a long time or reaching with your arms, lie with your spine in a neutral position and bend your knees slightly. Try the following positions: ? Lying on your side with a pillow between your knees. ? Lying on your back with a pillow under your knees. Lifting   When lifting objects, keep your feet at least shoulder width apart and tighten your abdominal muscles.  Bend your knees and hips and keep your spine neutral. It is important to lift using the strength of your legs, not your back. Do not lock your knees straight out.  Always ask for help to lift heavy or awkward objects. This information is not intended to replace advice given to you by your health care provider. Make sure you discuss any questions you have with your health care provider. Document Revised: 11/23/2018 Document Reviewed: 08/23/2018 Elsevier Patient Education  [place].

## 2020-07-11 NOTE — Progress Notes (Signed)
Positive glucose in urine. Follow up with routine labs including HgbA1c.

## 2020-07-17 IMAGING — CR DG CHEST 2V
2 series · 2 of 2 positions shown · non-contrast
Comparison: July 01, 2019

CLINICAL DATA: Chest pain

EXAM:
CHEST - 2 VIEW

[chest pa]
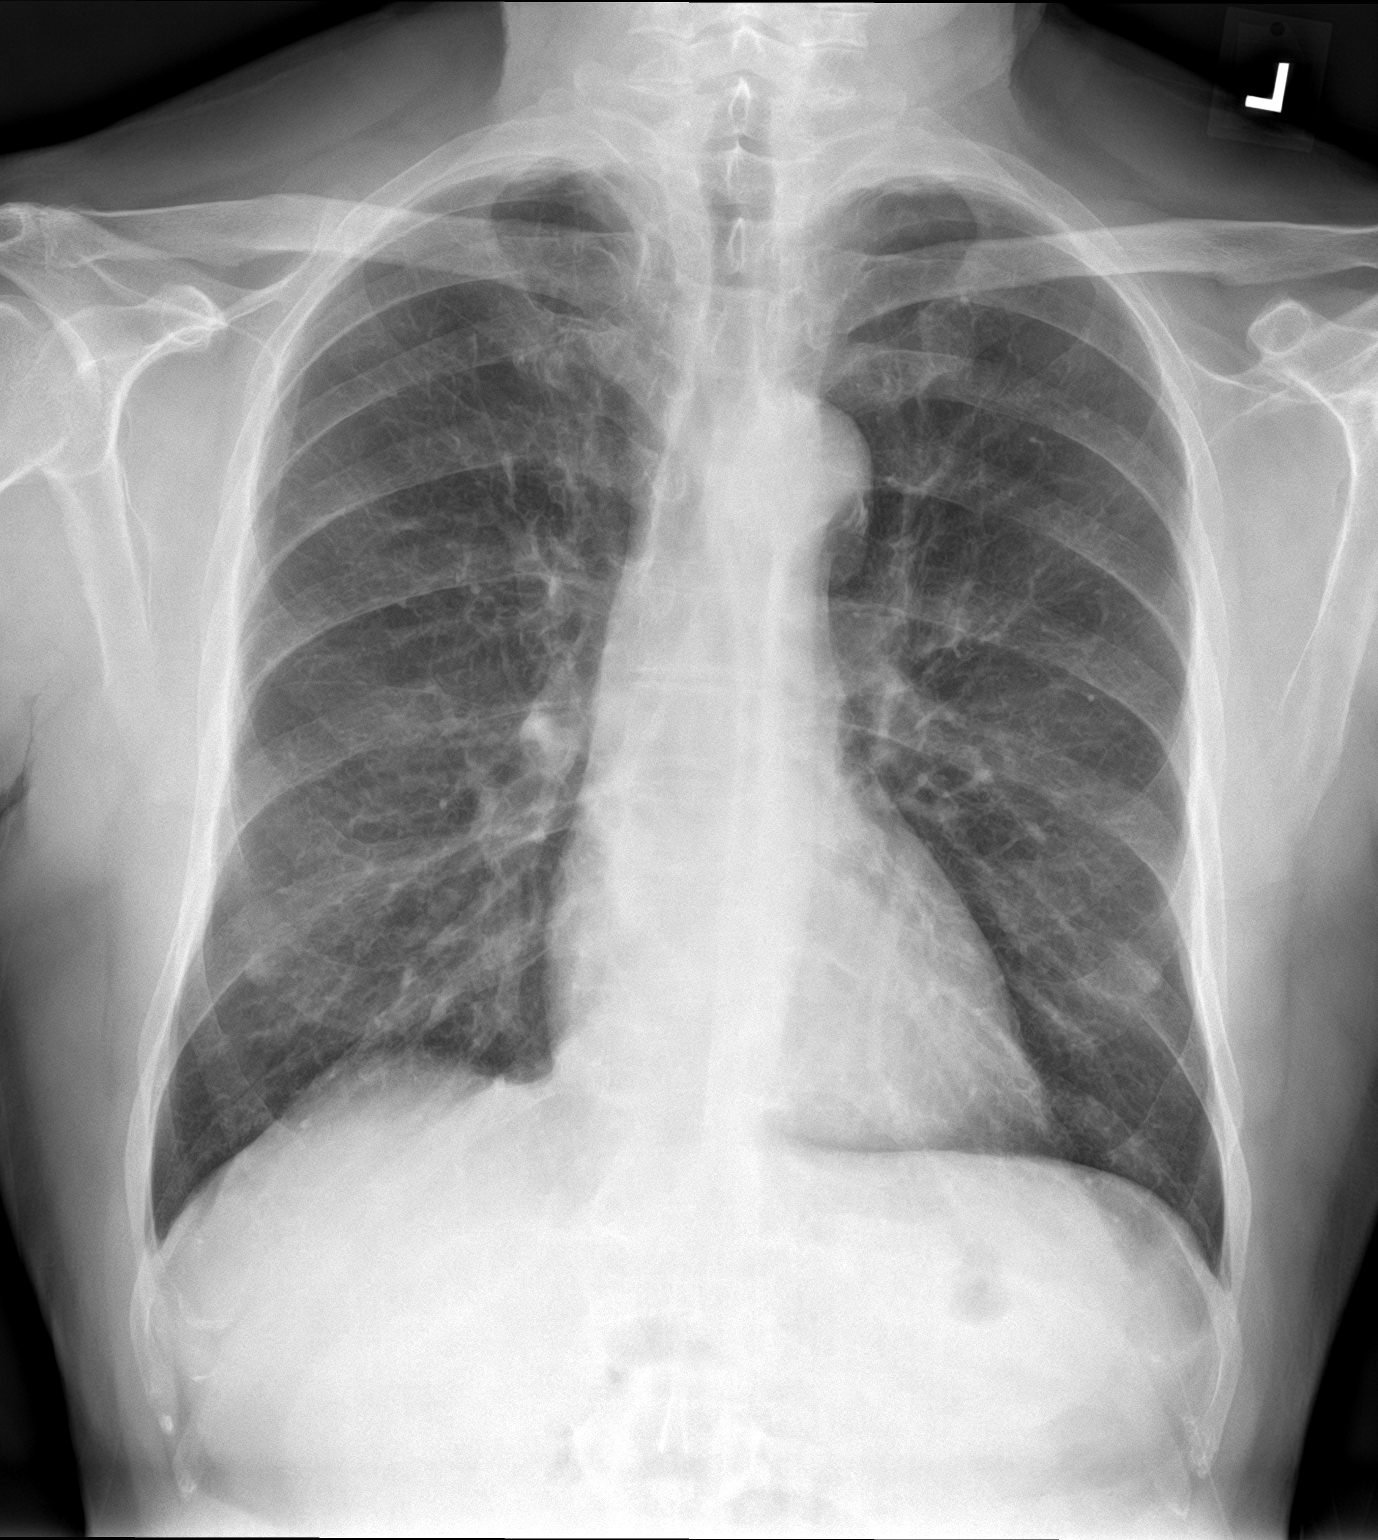

[chest lat]
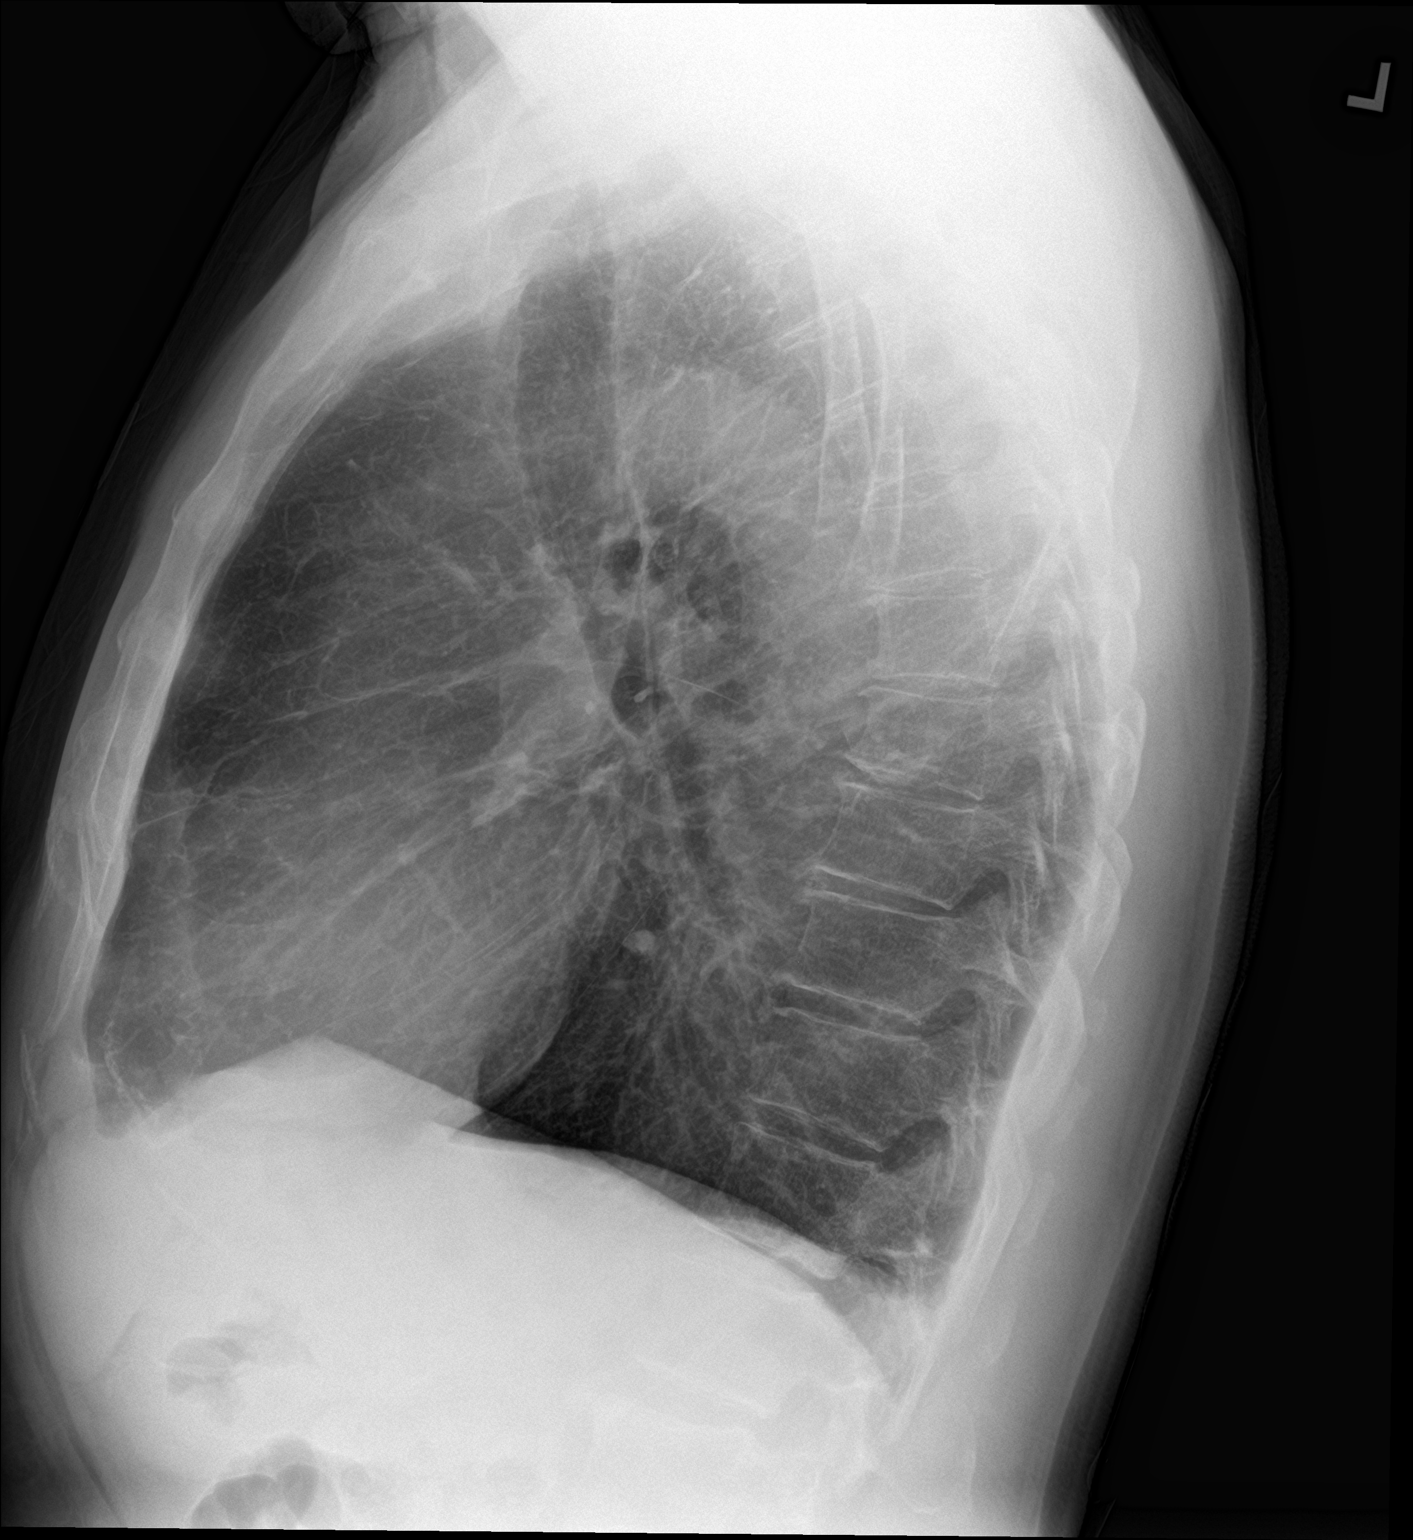

[2 of 2 positions shown; findings below may reference images not displayed]

FINDINGS: There are nipple shadows bilaterally. There is no appreciable edema
or consolidation. There is a mild scarring noted in the right upper
lobe. Heart size and pulmonary vascularity are normal. No
adenopathy. There is mild degenerative change in the thoracic spine.
IMPRESSION: Apparent nipple shadows bilaterally; repeat study with nipple
markers could confirm. No edema or consolidation. Slight scarring
right upper lobe. Cardiac silhouette within normal limits. No
demonstrable adenopathy.

## 2020-07-27 ENCOUNTER — Other Ambulatory Visit: Payer: Self-pay | Admitting: Nurse Practitioner

## 2020-07-27 DIAGNOSIS — E782 Mixed hyperlipidemia: Secondary | ICD-10-CM | POA: Diagnosis not present

## 2020-07-27 DIAGNOSIS — R7301 Impaired fasting glucose: Secondary | ICD-10-CM | POA: Diagnosis not present

## 2020-07-27 DIAGNOSIS — I10 Essential (primary) hypertension: Secondary | ICD-10-CM | POA: Diagnosis not present

## 2020-07-27 DIAGNOSIS — Z0001 Encounter for general adult medical examination with abnormal findings: Secondary | ICD-10-CM | POA: Diagnosis not present

## 2020-07-27 DIAGNOSIS — Z125 Encounter for screening for malignant neoplasm of prostate: Secondary | ICD-10-CM | POA: Diagnosis not present

## 2020-07-28 LAB — CBC
Hematocrit: 42.8 % (ref 37.5–51.0)
Hemoglobin: 14.7 g/dL (ref 13.0–17.7)
MCH: 32.2 pg (ref 26.6–33.0)
MCHC: 34.3 g/dL (ref 31.5–35.7)
MCV: 94 fL (ref 79–97)
Platelets: 280 10*3/uL (ref 150–450)
RBC: 4.56 x10E6/uL (ref 4.14–5.80)
RDW: 11.5 % — ABNORMAL LOW (ref 11.6–15.4)
WBC: 12.1 10*3/uL — ABNORMAL HIGH (ref 3.4–10.8)

## 2020-07-28 LAB — COMPREHENSIVE METABOLIC PANEL
ALT: 14 IU/L (ref 0–44)
AST: 13 IU/L (ref 0–40)
Albumin/Globulin Ratio: 2 (ref 1.2–2.2)
Albumin: 4.3 g/dL (ref 3.8–4.8)
Alkaline Phosphatase: 58 IU/L (ref 44–121)
BUN/Creatinine Ratio: 12 (ref 10–24)
BUN: 17 mg/dL (ref 8–27)
Bilirubin Total: 1 mg/dL (ref 0.0–1.2)
CO2: 25 mmol/L (ref 20–29)
Calcium: 9 mg/dL (ref 8.6–10.2)
Chloride: 105 mmol/L (ref 96–106)
Creatinine, Ser: 1.37 mg/dL — ABNORMAL HIGH (ref 0.76–1.27)
GFR calc Af Amer: 61 mL/min/{1.73_m2} (ref >59)
GFR calc non Af Amer: 53 mL/min/{1.73_m2} — ABNORMAL LOW (ref >59)
Globulin, Total: 2.1 g/dL (ref 1.5–4.5)
Glucose: 91 mg/dL (ref 65–99)
Potassium: 4.6 mmol/L (ref 3.5–5.2)
Sodium: 142 mmol/L (ref 134–144)
Total Protein: 6.4 g/dL (ref 6.0–8.5)

## 2020-07-28 LAB — LIPID PANEL WITH LDL/HDL RATIO
Cholesterol, Total: 205 mg/dL — ABNORMAL HIGH (ref 100–199)
HDL: 51 mg/dL (ref >39)
LDL Chol Calc (NIH): 138 mg/dL — ABNORMAL HIGH (ref 0–99)
LDL/HDL Ratio: 2.7 ratio (ref 0.0–3.6)
Triglycerides: 87 mg/dL (ref 0–149)
VLDL Cholesterol Cal: 16 mg/dL (ref 5–40)

## 2020-07-28 LAB — T4, FREE: Free T4: 1.28 ng/dL (ref 0.82–1.77)

## 2020-07-28 LAB — HGB A1C W/O EAG: Hgb A1c MFr Bld: 5.8 % — ABNORMAL HIGH (ref 4.8–5.6)

## 2020-07-28 LAB — PSA: Prostate Specific Ag, Serum: 0.7 ng/mL (ref 0.0–4.0)

## 2020-07-28 LAB — TSH: TSH: 1.94 u[IU]/mL (ref 0.450–4.500)

## 2020-08-01 DIAGNOSIS — R300 Dysuria: Secondary | ICD-10-CM | POA: Insufficient documentation

## 2020-08-01 DIAGNOSIS — M545 Low back pain, unspecified: Secondary | ICD-10-CM | POA: Insufficient documentation

## 2020-08-01 DIAGNOSIS — R81 Glycosuria: Secondary | ICD-10-CM | POA: Insufficient documentation

## 2020-08-12 NOTE — Progress Notes (Signed)
Discussed with patient during visit.

## 2021-01-04 ENCOUNTER — Encounter: Payer: Self-pay | Admitting: Nurse Practitioner

## 2021-01-04 ENCOUNTER — Ambulatory Visit (INDEPENDENT_AMBULATORY_CARE_PROVIDER_SITE_OTHER): Payer: Medicare Other | Admitting: Nurse Practitioner

## 2021-01-04 ENCOUNTER — Other Ambulatory Visit: Payer: Self-pay

## 2021-01-04 VITALS — BP 154/84 | HR 60 | Temp 97.5°F | Resp 16 | Ht 68.0 in | Wt 171.6 lb

## 2021-01-04 DIAGNOSIS — I10 Essential (primary) hypertension: Secondary | ICD-10-CM | POA: Diagnosis not present

## 2021-01-04 DIAGNOSIS — Z5320 Procedure and treatment not carried out because of patient's decision for unspecified reasons: Secondary | ICD-10-CM

## 2021-01-04 DIAGNOSIS — K219 Gastro-esophageal reflux disease without esophagitis: Secondary | ICD-10-CM | POA: Diagnosis not present

## 2021-01-04 MED ORDER — AMLODIPINE BESYLATE 10 MG PO TABS: 10.0000 mg | ORAL_TABLET | Freq: Every day | ORAL | 0 refills | Status: DC

## 2021-01-04 NOTE — Progress Notes (Signed)
[facility] [place], [place] [address]  Internal MEDICINE  Office Visit Note  Patient Name: Adam Hurst  [id number]  [id number]  Date of Service: 01/07/2021  Chief Complaint  Patient presents with  . Follow-up  . Gastroesophageal Reflux  . Hypertension    HPI  gerd protonix htn- amlodipine.   Adam Hurst presents for a follow-up to discuss gastroesophageal reflux disease and hypertension.  He reports that he quit smoking 1 month ago.  Discussed routine lung cancer screening with low-dose CT scan of the chest, he requested to hold off on that test at this time.  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  He was sick with flulike symptoms about a month ago he did end up losing a few pounds then.  With his hypertension, concerned about salt intake.  He reports that his wife helps him make sure that he is sticking with a low-sodium diet. - Today's blood pressure was 154/84.  He reports that his average systolic blood pressure is in the 140s at home.  For his hypertension he takes amlodipine. Adam Hurst takes pantoprazole 40 mg daily for his gastroesophageal reflux disease.  He reports that this medication is helpful and his acid reflux is under control at this time.  Current Medication: Outpatient Encounter Medications as of 01/04/2021  Medication Sig  . acetaminophen (TYLENOL) 500 MG tablet Take 500 mg by mouth every 6 (six) hours as needed for moderate pain or headache.  Marland Kitchen amLODipine (NORVASC) 10 MG tablet Take 1 tablet (10 mg total) by mouth daily.  Marland Kitchen aspirin EC 81 MG tablet Take 81 mg by mouth daily.  . pantoprazole (PROTONIX) 40 MG tablet Take 1 tablet (40 mg total) by mouth daily.  . [DISCONTINUED] amLODipine (NORVASC) 5 MG tablet Take 1 tablet (5 mg total) by mouth daily.  . [DISCONTINUED] ibuprofen (ADVIL) 200 MG tablet Take 200 mg by mouth every 6 (six) hours as needed for moderate pain.   No facility-administered  encounter medications on file as of 01/04/2021.    Surgical History: Past Surgical History:  Procedure Laterality Date  . COLONOSCOPY W/ POLYPECTOMY    . VASECTOMY Bilateral 1991  . XI ROBOTIC ASSISTED VENTRAL HERNIA N/A 03/06/2020   Procedure: XI ROBOTIC ASSISTED VENTRAL HERNIA;  Surgeon: Herbert Pun, MD;  Location: [facility] ORS;  Service: General;  Laterality: N/A;    Medical History: Past Medical History:  Diagnosis Date  . GERD (gastroesophageal reflux disease)   . Hypertension     Family History: Family History  Problem Relation Age of Onset  . Alzheimer's disease Mother   . Cancer Father     Social History   Socioeconomic History  . Marital status: Married    Spouse name: Not on file  . Number of children: Not on file  . Years of education: Not on file  . Highest education level: Not on file  Occupational History  . Not on file  Tobacco Use  . Smoking status: Former Smoker    Packs/day: 1.00    Types: Cigarettes    Quit date: 12/12/2020    Years since quitting: 0.0  . Smokeless tobacco: Never Used  Vaping Use  . Vaping Use: Never used  Substance and Sexual Activity  . Alcohol use: Yes    Comment: rare  . Drug use: Never  . Sexual activity: Not on file  Other Topics Concern  . Not on file  Social History Narrative  .  Not on file   Social Determinants of Health   Financial Resource Strain: Not on file  Food Insecurity: Not on file  Transportation Needs: Not on file  Physical Activity: Not on file  Stress: Not on file  Social Connections: Not on file  Intimate Partner Violence: Not on file      Review of Systems  Constitutional: Negative for chills, fatigue and unexpected weight change.  HENT: Negative for congestion, rhinorrhea, sneezing and sore throat.   Eyes: Negative for redness.  Respiratory: Negative for cough, chest tightness and shortness of breath.   Cardiovascular: Negative for chest pain and palpitations.  Gastrointestinal:  Negative for abdominal pain, constipation, diarrhea, nausea and vomiting.  Genitourinary: Negative for dysuria and frequency.  Musculoskeletal: Negative for arthralgias, back pain, joint swelling and neck pain.  Skin: Negative for rash.  Neurological: Negative.  Negative for tremors and numbness.  Hematological: Negative for adenopathy. Does not bruise/bleed easily.  Psychiatric/Behavioral: Negative for behavioral problems (Depression), sleep disturbance and suicidal ideas. The patient is not nervous/anxious.     Vital Signs: BP (!) 154/84   Pulse 60   Temp (!) 97.5 F (36.4 C)   Resp 16   Ht 5\' 8"  (1.727 m)   Wt 171 lb 9.6 oz (77.8 kg)   SpO2 98%   BMI 26.09 kg/m    Physical Exam Vitals reviewed.  Constitutional:      General: He is not in acute distress.    Appearance: Normal appearance. He is well-developed and overweight. He is not diaphoretic.  HENT:     Head: Normocephalic and atraumatic.  Neck:     Thyroid: No thyromegaly.     Vascular: No JVD.     Trachea: No tracheal deviation.  Cardiovascular:     Rate and Rhythm: Normal rate and regular rhythm.     Pulses: Normal pulses.     Heart sounds: Normal heart sounds. No murmur heard. No friction rub. No gallop.   Pulmonary:     Effort: Pulmonary effort is normal. No respiratory distress.     Breath sounds: No wheezing or rales.  Chest:     Chest wall: No tenderness.  Skin:    General: Skin is warm and dry.  Neurological:     Mental Status: He is alert and oriented to person, place, and time.     Cranial Nerves: No cranial nerve deficit.  Psychiatric:        Mood and Affect: Mood normal.        Behavior: Behavior normal.        Thought Content: Thought content normal.        Judgment: Judgment normal.    Assessment/Plan: 1. Essential hypertension Blood pressure remains elevated on amlodipine 5 mg.  Amlodipine increased to 10 mg daily we will follow-up in 4 weeks to assess effectiveness of dose adjustment.   Instructed patient to check blood pressure at least once a day and write it down.  Instructed him to bring the record with him at his follow-up appointment to be reviewed. - amLODipine (NORVASC) 10 MG tablet; Take 1 tablet (10 mg total) by mouth daily.  Dispense: 90 tablet; Refill: 0  2. Gastroesophageal reflux disease without esophagitis Takes pantoprazole 40 mg daily for acid reflux, this is well controlled on the current medication regimen per patient report.  No changes to current medication.  3. Screening for malignant neoplasm of lung declined by patient The patient has been an every day smoker for several years.  He recently quit 1 month ago.  Due to his history of tobacco use specifically cigarettes, a low-dose CT scan of the chest is recommended to screen for lung cancer.  He declined to have the screening for lung cancer.  Will readdress at a future office visit.   General Counseling: Yasmin verbalizes understanding of the findings of todays visit and agrees with plan of treatment. I have discussed any further diagnostic evaluation that may be needed or ordered today. We also reviewed his medications today. he has been encouraged to call the office with any questions or concerns that should arise related to todays visit.    No orders of the defined types were placed in this encounter.   Meds ordered this encounter  Medications  . amLODipine (NORVASC) 10 MG tablet    Sig: Take 1 tablet (10 mg total) by mouth daily.    Dispense:  90 tablet    Refill:  0    Order Specific Question:   Supervising Provider    Answer:   Lavera Guise [[id number]]   Return in about 4 weeks (around 02/01/2021) for F/U BP check , med refill, Abbigaile Rockman PCP.  Total time spent: 30 Minutes Time spent includes review of chart, medications, test results, and follow up plan with the patient.   [place] Controlled Substance Database was reviewed by me.  This patient was seen by Jonetta Osgood, FNP-C in collaboration with  Dr. Clayborn Bigness as a part of collaborative care agreement.     Dr Lavera Guise Internal medicine

## 2021-01-29 ENCOUNTER — Encounter: Payer: Self-pay | Admitting: Nurse Practitioner

## 2021-01-29 ENCOUNTER — Other Ambulatory Visit: Payer: Self-pay

## 2021-01-29 ENCOUNTER — Ambulatory Visit (INDEPENDENT_AMBULATORY_CARE_PROVIDER_SITE_OTHER): Payer: Medicare Other | Admitting: Nurse Practitioner

## 2021-01-29 VITALS — BP 138/72 | HR 64 | Temp 97.9°F | Resp 16 | Ht 68.0 in | Wt 167.2 lb

## 2021-01-29 DIAGNOSIS — I10 Essential (primary) hypertension: Secondary | ICD-10-CM

## 2021-01-29 DIAGNOSIS — K219 Gastro-esophageal reflux disease without esophagitis: Secondary | ICD-10-CM

## 2021-01-29 DIAGNOSIS — Z5320 Procedure and treatment not carried out because of patient's decision for unspecified reasons: Secondary | ICD-10-CM | POA: Diagnosis not present

## 2021-01-29 MED ORDER — AMLODIPINE BESYLATE 10 MG PO TABS
10.0000 mg | ORAL_TABLET | Freq: Every day | ORAL | 1 refills | Status: DC
Start: 2021-01-29 — End: 2021-09-16

## 2021-01-29 NOTE — Progress Notes (Signed)
[facility] [place], [place] [address]  Internal MEDICINE  Office Visit Note  Patient Name: Adam Hurst  [id number]  [id number]  Date of Service: 01/30/2021  Chief Complaint  Patient presents with   Follow-up    Med refill   Gastroesophageal Reflux   Hypertension   Quality Metric Gaps    shingrix    HPI Ellie presents for a follow up visit to discuss blood pressure and recent medication change. At his previous visit approximately 4 weeks ago, his amlodipine dose was increased to 10 mg daily to provide tighter control on his blood pressure. He reports that his blood pressures at home have been lower. His blood pressure at today's office visit is 138/72.  He previously declined low-dose CT chest for lung cancer screening, he is still not interested in having this test ordered yet. He states that he still has not picked up cigarette smoking so it has been approximately 2 months since he quit.  His acid reflux still remains under control with current medication.    Current Medication: Outpatient Encounter Medications as of 01/29/2021  Medication Sig   acetaminophen (TYLENOL) 500 MG tablet Take 500 mg by mouth every 6 (six) hours as needed for moderate pain or headache.   aspirin EC 81 MG tablet Take 81 mg by mouth daily.   pantoprazole (PROTONIX) 40 MG tablet Take 1 tablet (40 mg total) by mouth daily.   [DISCONTINUED] amLODipine (NORVASC) 10 MG tablet Take 1 tablet (10 mg total) by mouth daily.   amLODipine (NORVASC) 10 MG tablet Take 1 tablet (10 mg total) by mouth daily.   No facility-administered encounter medications on file as of 01/29/2021.    Surgical History: Past Surgical History:  Procedure Laterality Date   COLONOSCOPY W/ POLYPECTOMY     VASECTOMY Bilateral 1991   XI ROBOTIC ASSISTED VENTRAL HERNIA N/A 03/06/2020   Procedure: XI ROBOTIC ASSISTED VENTRAL HERNIA;  Surgeon: Herbert Pun, MD;  Location: [facility] ORS;  Service:  General;  Laterality: N/A;    Medical History: Past Medical History:  Diagnosis Date   GERD (gastroesophageal reflux disease)    Hypertension     Family History: Family History  Problem Relation Age of Onset   Alzheimer's disease Mother    Cancer Father     Social History   Socioeconomic History   Marital status: Married    Spouse name: Not on file   Number of children: Not on file   Years of education: Not on file   Highest education level: Not on file  Occupational History   Not on file  Tobacco Use   Smoking status: Former    Packs/day: 1.00    Pack years: 0.00    Types: Cigarettes    Quit date: 12/12/2020    Years since quitting: 0.1   Smokeless tobacco: Never  Vaping Use   Vaping Use: Never used  Substance and Sexual Activity   Alcohol use: Yes    Comment: rare   Drug use: Never   Sexual activity: Not on file  Other Topics Concern   Not on file  Social History Narrative   Not on file   Social Determinants of Health   Financial Resource Strain: Not on file  Food Insecurity: Not on file  Transportation Needs: Not on file  Physical Activity: Not on file  Stress: Not on file  Social Connections: Not on file  Intimate Partner Violence: Not on file      Review  of Systems  Constitutional:  Negative for chills, fatigue and unexpected weight change.  HENT:  Negative for congestion, rhinorrhea, sneezing and sore throat.   Eyes:  Negative for redness.  Respiratory:  Negative for cough, chest tightness, shortness of breath and wheezing.   Cardiovascular:  Negative for chest pain and palpitations.  Gastrointestinal:  Negative for abdominal pain, constipation, diarrhea, nausea and vomiting.  Genitourinary:  Negative for dysuria and frequency.  Musculoskeletal:  Negative for arthralgias, back pain, joint swelling and neck pain.  Skin:  Negative for rash.  Neurological: Negative.  Negative for tremors and numbness.  Hematological:  Negative for adenopathy.  Does not bruise/bleed easily.  Psychiatric/Behavioral:  Negative for behavioral problems (Depression), sleep disturbance and suicidal ideas. The patient is not nervous/anxious.    Vital Signs: BP 138/72   Pulse 64   Temp 97.9 F (36.6 C)   Resp 16   Ht 5\' 8"  (1.727 m)   Wt 167 lb 3.2 oz (75.8 kg)   SpO2 98%   BMI 25.42 kg/m    Physical Exam Vitals reviewed.  Constitutional:      General: He is not in acute distress.    Appearance: Normal appearance. He is not ill-appearing.  HENT:     Head: Normocephalic and atraumatic.  Cardiovascular:     Rate and Rhythm: Normal rate and regular rhythm.     Pulses: Normal pulses.     Heart sounds: Normal heart sounds.  Pulmonary:     Effort: Pulmonary effort is normal. No respiratory distress.     Breath sounds: Normal breath sounds. No wheezing.  Skin:    General: Skin is warm and dry.     Capillary Refill: Capillary refill takes less than 2 seconds.  Neurological:     Mental Status: He is alert and oriented to person, place, and time.  Psychiatric:        Mood and Affect: Mood normal.        Behavior: Behavior normal.       Assessment/Plan: 1. Essential hypertension Blood pressure is much more controlled since amlodipine dose was increased 4 weeks ago. Refill ordered. Will reevaluate blood pressure in 3 months.  - amLODipine (NORVASC) 10 MG tablet; Take 1 tablet (10 mg total) by mouth daily.  Dispense: 90 tablet; Refill: 1  2. Gastroesophageal reflux disease without esophagitis Continues to remain under control with current medication, no refill needed at this time.   3. Screening for malignant neoplasm of lung declined by patient He continues to decline CT scan. Will discuss at follow up visit in 3 months. It has now been 2 months since he quit smoking.    General Counseling: Kendricks verbalizes understanding of the findings of todays visit and agrees with plan of treatment. I have discussed any further diagnostic evaluation  that may be needed or ordered today. We also reviewed his medications today. he has been encouraged to call the office with any questions or concerns that should arise related to todays visit.    No orders of the defined types were placed in this encounter.   Meds ordered this encounter  Medications   amLODipine (NORVASC) 10 MG tablet    Sig: Take 1 tablet (10 mg total) by mouth daily.    Dispense:  90 tablet    Refill:  1    Return in about 3 months (around 05/01/2021) for F/U, Vanetta Rule PCP, BP check.   Total time spent:30 Minutes Time spent includes review of chart, medications, test results,  and follow up plan with the patient.   [place] Controlled Substance Database was reviewed by me.  This patient was seen by Jonetta Osgood, FNP-C in collaboration with Dr. Clayborn Bigness as a part of collaborative care agreement.   Sharda Keddy R. Valetta Fuller, MSN, FNP-C Internal medicine

## 2021-02-01 ENCOUNTER — Ambulatory Visit: Payer: Medicare Other | Admitting: Nurse Practitioner

## 2021-03-30 ENCOUNTER — Other Ambulatory Visit: Payer: Self-pay | Admitting: Nurse Practitioner

## 2021-03-30 ENCOUNTER — Other Ambulatory Visit: Payer: Self-pay

## 2021-03-30 DIAGNOSIS — K219 Gastro-esophageal reflux disease without esophagitis: Secondary | ICD-10-CM

## 2021-03-30 MED ORDER — PANTOPRAZOLE SODIUM 40 MG PO TBEC: 40.0000 mg | DELAYED_RELEASE_TABLET | Freq: Every day | ORAL | 1 refills | Status: DC

## 2021-05-06 ENCOUNTER — Other Ambulatory Visit: Payer: Self-pay

## 2021-05-06 ENCOUNTER — Encounter: Payer: Self-pay | Admitting: Nurse Practitioner

## 2021-05-06 ENCOUNTER — Ambulatory Visit (INDEPENDENT_AMBULATORY_CARE_PROVIDER_SITE_OTHER): Payer: Medicare Other | Admitting: Nurse Practitioner

## 2021-05-06 VITALS — BP 134/85 | HR 60 | Temp 98.8°F | Resp 16 | Ht 68.0 in | Wt 164.2 lb

## 2021-05-06 DIAGNOSIS — N289 Disorder of kidney and ureter, unspecified: Secondary | ICD-10-CM | POA: Diagnosis not present

## 2021-05-06 DIAGNOSIS — R7309 Other abnormal glucose: Secondary | ICD-10-CM | POA: Diagnosis not present

## 2021-05-06 DIAGNOSIS — N429 Disorder of prostate, unspecified: Secondary | ICD-10-CM

## 2021-05-06 DIAGNOSIS — E782 Mixed hyperlipidemia: Secondary | ICD-10-CM | POA: Diagnosis not present

## 2021-05-06 DIAGNOSIS — E559 Vitamin D deficiency, unspecified: Secondary | ICD-10-CM

## 2021-05-06 DIAGNOSIS — D72829 Elevated white blood cell count, unspecified: Secondary | ICD-10-CM

## 2021-05-06 DIAGNOSIS — I10 Essential (primary) hypertension: Secondary | ICD-10-CM

## 2021-05-06 NOTE — Progress Notes (Signed)
[facility] [place],  [address]  Internal MEDICINE  Office Visit Note  Patient Name: Adam Hurst  [id number]  [id number]  Date of Service: 05/06/2021  Chief Complaint  Patient presents with   Follow-up   Hypertension    HPI Adam Hurst presents for a follow up visit for hypertension. He was started on amlodipine a few months ago His blood pressure is elevated at today's visit but was 134/85 at home today and runs around that most of the time.     Current Medication: Outpatient Encounter Medications as of 05/06/2021  Medication Sig   acetaminophen (TYLENOL) 500 MG tablet Take 500 mg by mouth every 6 (six) hours as needed for moderate pain or headache.   amLODipine (NORVASC) 10 MG tablet Take 1 tablet (10 mg total) by mouth daily.   aspirin EC 81 MG tablet Take 81 mg by mouth daily.   pantoprazole (PROTONIX) 40 MG tablet Take 1 tablet (40 mg total) by mouth daily.   No facility-administered encounter medications on file as of 05/06/2021.    Surgical History: Past Surgical History:  Procedure Laterality Date   COLONOSCOPY W/ POLYPECTOMY     VASECTOMY Bilateral 1991   XI ROBOTIC ASSISTED VENTRAL HERNIA N/A 03/06/2020   Procedure: XI ROBOTIC ASSISTED VENTRAL HERNIA;  Surgeon: Adam Pun, MD;  Location: [facility] ORS;  Service: General;  Laterality: N/A;    Medical History: Past Medical History:  Diagnosis Date   GERD (gastroesophageal reflux disease)    Hypertension     Family History: Family History  Problem Relation Age of Onset   Alzheimer's disease Mother    Cancer Father     Social History   Socioeconomic History   Marital status: Married    Spouse name: Not on file   Number of children: Not on file   Years of education: Not on file   Highest education level: Not on file  Occupational History   Not on file  Tobacco Use   Smoking status: Former    Packs/day: 1.00    Types: Cigarettes    Quit date: 12/12/2020     Years since quitting: 0.3   Smokeless tobacco: Never  Vaping Use   Vaping Use: Never used  Substance and Sexual Activity   Alcohol use: Yes    Comment: rare   Drug use: Never   Sexual activity: Not on file  Other Topics Concern   Not on file  Social History Narrative   Not on file   Social Determinants of Health   Financial Resource Strain: Not on file  Food Insecurity: Not on file  Transportation Needs: Not on file  Physical Activity: Not on file  Stress: Not on file  Social Connections: Not on file  Intimate Partner Violence: Not on file      Review of Systems  Constitutional:  Negative for chills, fatigue and unexpected weight change.  HENT:  Negative for congestion, rhinorrhea, sneezing and sore throat.   Eyes:  Negative for redness.  Respiratory:  Negative for cough, chest tightness and shortness of breath.   Cardiovascular:  Negative for chest pain and palpitations.  Gastrointestinal:  Negative for abdominal pain, constipation, diarrhea, nausea and vomiting.  Genitourinary:  Negative for dysuria and frequency.  Musculoskeletal:  Negative for arthralgias, back pain, joint swelling and neck pain.  Skin:  Negative for rash.  Neurological: Negative.  Negative for tremors and numbness.  Hematological:  Negative for adenopathy. Does not bruise/bleed easily.  Psychiatric/Behavioral:  Negative for behavioral problems (Depression), sleep disturbance and suicidal ideas. The patient is not nervous/anxious.    Vital Signs: BP 134/85 Comment: 154/90, 150/82  Pulse 60   Temp 98.8 F (37.1 C)   Resp 16   Ht '5\' 8"'  (1.727 m)   Wt 164 lb 3.2 oz (74.5 kg)   SpO2 98%   BMI 24.97 kg/m    Physical Exam Constitutional:      General: He is not in acute distress.    Appearance: Normal appearance. He is normal weight. He is not ill-appearing.  HENT:     Head: Normocephalic and atraumatic.  Eyes:     Extraocular Movements: Extraocular movements intact.     Pupils: Pupils are  equal, round, and reactive to light.  Cardiovascular:     Rate and Rhythm: Normal rate and regular rhythm.  Pulmonary:     Effort: Pulmonary effort is normal. No respiratory distress.  Neurological:     Mental Status: He is alert and oriented to person, place, and time.     Cranial Nerves: No cranial nerve deficit.     Coordination: Coordination normal.     Gait: Gait normal.  Psychiatric:        Mood and Affect: Mood normal.        Behavior: Behavior normal.     Assessment/Plan: 1. Essential hypertension Stable with current medications  2. Leukocytosis, unspecified type Routine lab ordered - CBC with Differential/Platelet  3. Abnormal kidney function Routine lab ordered - CMP14+EGFR  4. Mixed hyperlipidemia Routine lab ordered - TSH + free T4 - Lipid Profile  5. Vitamin D deficiency Routine lab ordered - Vitamin D (25 hydroxy)  6. Other abnormal glucose  Routine lab ordered - Hemoglobin A1c  7. Disorder of prostate, unspecified  Routine lab ordered - PSA, total and free   General Counseling: Adam Hurst verbalizes understanding of the findings of todays visit and agrees with plan of treatment. I have discussed any further diagnostic evaluation that may be needed or ordered today. We also reviewed his medications today. he has been encouraged to call the office with any questions or concerns that should arise related to todays visit.    Orders Placed This Encounter  Procedures   CMP14+EGFR   CBC with Differential/Platelet   TSH + free T4   Hemoglobin A1c   Lipid Profile   PSA, total and free   Vitamin D (25 hydroxy)    No orders of the defined types were placed in this encounter.   Return in 2 months (on 07/13/2021) for previously scheduled, CPE, Adam Hurst PCP.   Total time spent:20 Minutes Time spent includes review of chart, medications, test results, and follow up plan with the patient.    Controlled Substance Database was reviewed by me.  This  patient was seen by Adam Osgood, FNP-C in collaboration with Dr. Clayborn Hurst as a part of collaborative care agreement.   Adam Hurst R. Adam Fuller, MSN, FNP-C Internal medicine

## 2021-05-10 ENCOUNTER — Other Ambulatory Visit: Payer: Self-pay

## 2021-05-10 ENCOUNTER — Ambulatory Visit
Admission: EM | Admit: 2021-05-10 | Discharge: 2021-05-10 | Disposition: A | Discharge status: Home / Self Care | Payer: Medicare Other | Attending: Emergency Medicine | Admitting: Emergency Medicine

## 2021-05-10 ENCOUNTER — Encounter: Payer: Self-pay | Admitting: Emergency Medicine

## 2021-05-10 DIAGNOSIS — T162XXA Foreign body in left ear, initial encounter: Secondary | ICD-10-CM

## 2021-05-10 NOTE — Discharge Instructions (Addendum)
Return as needed

## 2021-05-10 NOTE — ED Triage Notes (Signed)
Pt here with cap of hearing aid stuck in left ear.

## 2021-05-10 NOTE — ED Provider Notes (Signed)
Chief Complaint   Chief Complaint  Patient presents with   Foreign Body in [place], HPI  Adam Hurst is a very pleasant 70 y.o. male who presents with foreign body to left ear.  Patient reports removing his hearing aid and a piece of his hearing aid popped off into the left ear.  He does not report any pain.  Patient's problem list, past medical and social history, medications, and allergies were reviewed by me and updated in Epic.   ROS  See HPI.  Objective   Vitals:   05/10/21 0917  BP: (!) 158/96  Pulse: 83  Resp: 18  Temp: 98.3 F (36.8 C)  SpO2: 98%     General: Appears well-developed and well-nourished. No acute distress.  HEENT Head: Normocephalic and atraumatic. Ears: Bilateral: Hearing grossly intact. No drainage or visible deformity. No mastoid erythema, edema, or tenderness.  Right: TM clear without FB Left: Small rubber FB present.  Foreign Body Removal  Date/Time: 05/10/2021 9:35 AM Performed by: Serafina Royals, FNP Authorized by: Serafina Royals, FNP   Consent:    Consent obtained:  Verbal   Consent given by:  Patient   Risks, benefits, and alternatives were discussed: yes     Risks discussed:  Pain   Alternatives discussed:  No treatment Universal protocol:    Patient identity confirmed:  Verbally with patient Location:    Location:  Ear   Ear location:  L ear   Depth: external canal.   Tendon involvement:  None Anesthesia:    Anesthesia method:  None Procedure type:    Procedure complexity:  Simple Procedure details:    Removal mechanism:  Forceps   Foreign bodies recovered:  1   Description:  Small rubber tip of hearing aid   Intact foreign body removal: yes   Post-procedure details:    Confirmation:  No additional foreign bodies on visualization   Skin closure:  None   Procedure completion:  Tolerated well, no immediate complications    Vital signs and nursing note reviewed.    Assessment & Plan  1. Foreign  body of left ear, initial encounter  70 y.o. male presents with foreign body to left ear.  Patient reports removing his hearing aid and a piece of his hearing aid popped off into the left ear.  He does not report any pain.  No perforation to left TM post FB removal.  Patient tolerated well.  No anesthesia needed.  Return as needed.  Patient verbalized understanding and agreed with plan.  Patient stable upon discharge.  Plan:   Discharge Instructions      Return as needed         Serafina Royals, [place] 05/10/21 [phone]

## 2021-06-07 ENCOUNTER — Other Ambulatory Visit: Payer: Self-pay

## 2021-06-07 ENCOUNTER — Ambulatory Visit (INDEPENDENT_AMBULATORY_CARE_PROVIDER_SITE_OTHER): Payer: Medicare Other

## 2021-06-07 DIAGNOSIS — Z23 Encounter for immunization: Secondary | ICD-10-CM

## 2021-06-24 DIAGNOSIS — R7309 Other abnormal glucose: Secondary | ICD-10-CM | POA: Diagnosis not present

## 2021-06-24 DIAGNOSIS — D72829 Elevated white blood cell count, unspecified: Secondary | ICD-10-CM | POA: Diagnosis not present

## 2021-06-24 DIAGNOSIS — N289 Disorder of kidney and ureter, unspecified: Secondary | ICD-10-CM | POA: Diagnosis not present

## 2021-06-24 DIAGNOSIS — Z125 Encounter for screening for malignant neoplasm of prostate: Secondary | ICD-10-CM | POA: Diagnosis not present

## 2021-06-24 DIAGNOSIS — E782 Mixed hyperlipidemia: Secondary | ICD-10-CM | POA: Diagnosis not present

## 2021-06-24 DIAGNOSIS — E559 Vitamin D deficiency, unspecified: Secondary | ICD-10-CM | POA: Diagnosis not present

## 2021-06-24 DIAGNOSIS — N429 Disorder of prostate, unspecified: Secondary | ICD-10-CM | POA: Diagnosis not present

## 2021-06-24 DIAGNOSIS — R81 Glycosuria: Secondary | ICD-10-CM | POA: Diagnosis not present

## 2021-06-25 LAB — CBC WITH DIFFERENTIAL/PLATELET
Basophils Absolute: 0 10*3/uL (ref 0.0–0.2)
Basos: 0 %
EOS (ABSOLUTE): 0.1 10*3/uL (ref 0.0–0.4)
Eos: 1 %
Hematocrit: 43.1 % (ref 37.5–51.0)
Hemoglobin: 15 g/dL (ref 13.0–17.7)
Immature Grans (Abs): 0 10*3/uL (ref 0.0–0.1)
Immature Granulocytes: 0 %
Lymphocytes Absolute: 2.4 10*3/uL (ref 0.7–3.1)
Lymphs: 22 %
MCH: 31.6 pg (ref 26.6–33.0)
MCHC: 34.8 g/dL (ref 31.5–35.7)
MCV: 91 fL (ref 79–97)
Monocytes Absolute: 1 10*3/uL — ABNORMAL HIGH (ref 0.1–0.9)
Monocytes: 9 %
Neutrophils Absolute: 7.3 10*3/uL — ABNORMAL HIGH (ref 1.4–7.0)
Neutrophils: 68 %
Platelets: 309 10*3/uL (ref 150–450)
RBC: 4.74 x10E6/uL (ref 4.14–5.80)
RDW: 11.7 % (ref 11.6–15.4)
WBC: 10.8 10*3/uL (ref 3.4–10.8)

## 2021-06-25 LAB — CMP14+EGFR
ALT: 13 IU/L (ref 0–44)
AST: 15 IU/L (ref 0–40)
Albumin/Globulin Ratio: 1.8 (ref 1.2–2.2)
Albumin: 4.3 g/dL (ref 3.8–4.8)
Alkaline Phosphatase: 59 IU/L (ref 44–121)
BUN/Creatinine Ratio: 14 (ref 10–24)
BUN: 20 mg/dL (ref 8–27)
Bilirubin Total: 0.9 mg/dL (ref 0.0–1.2)
CO2: 24 mmol/L (ref 20–29)
Calcium: 9.4 mg/dL (ref 8.6–10.2)
Chloride: 102 mmol/L (ref 96–106)
Creatinine, Ser: 1.46 mg/dL — ABNORMAL HIGH (ref 0.76–1.27)
Globulin, Total: 2.4 g/dL (ref 1.5–4.5)
Glucose: 95 mg/dL (ref 70–99)
Potassium: 4.4 mmol/L (ref 3.5–5.2)
Sodium: 139 mmol/L (ref 134–144)
Total Protein: 6.7 g/dL (ref 6.0–8.5)
eGFR: 52 mL/min/{1.73_m2} — ABNORMAL LOW (ref >59)

## 2021-06-25 LAB — LIPID PANEL
Chol/HDL Ratio: 4 ratio (ref 0.0–5.0)
Cholesterol, Total: 193 mg/dL (ref 100–199)
HDL: 48 mg/dL (ref >39)
LDL Chol Calc (NIH): 125 mg/dL — ABNORMAL HIGH (ref 0–99)
Triglycerides: 110 mg/dL (ref 0–149)
VLDL Cholesterol Cal: 20 mg/dL (ref 5–40)

## 2021-06-25 LAB — TSH+FREE T4
Free T4: 1.3 ng/dL (ref 0.82–1.77)
TSH: 2.16 u[IU]/mL (ref 0.450–4.500)

## 2021-06-25 LAB — HEMOGLOBIN A1C
Est. average glucose Bld gHb Est-mCnc: 126 mg/dL
Hgb A1c MFr Bld: 6 % — ABNORMAL HIGH (ref 4.8–5.6)

## 2021-06-25 LAB — PSA, TOTAL AND FREE
PSA, Free Pct: 24.3 %
PSA, Free: 0.17 ng/mL
Prostate Specific Ag, Serum: 0.7 ng/mL (ref 0.0–4.0)

## 2021-06-25 LAB — VITAMIN D 25 HYDROXY (VIT D DEFICIENCY, FRACTURES): Vit D, 25-Hydroxy: 36.1 ng/mL (ref 30.0–100.0)

## 2021-06-25 NOTE — Progress Notes (Signed)
I have reviewed the lab results. There are no critically abnormal values requiring immediate intervention but there are some abnormals that will be discussed at the next office visit.  

## 2021-07-13 ENCOUNTER — Other Ambulatory Visit: Payer: Self-pay

## 2021-07-13 ENCOUNTER — Encounter: Payer: Self-pay | Admitting: Nurse Practitioner

## 2021-07-13 ENCOUNTER — Ambulatory Visit (INDEPENDENT_AMBULATORY_CARE_PROVIDER_SITE_OTHER): Payer: Medicare Other | Admitting: Nurse Practitioner

## 2021-07-13 VITALS — BP 140/75 | HR 61 | Temp 98.4°F | Resp 16 | Ht 68.0 in | Wt 165.8 lb

## 2021-07-13 DIAGNOSIS — E559 Vitamin D deficiency, unspecified: Secondary | ICD-10-CM | POA: Diagnosis not present

## 2021-07-13 DIAGNOSIS — I10 Essential (primary) hypertension: Secondary | ICD-10-CM

## 2021-07-13 DIAGNOSIS — R7303 Prediabetes: Secondary | ICD-10-CM

## 2021-07-13 DIAGNOSIS — Z0001 Encounter for general adult medical examination with abnormal findings: Secondary | ICD-10-CM | POA: Diagnosis not present

## 2021-07-13 DIAGNOSIS — R300 Dysuria: Secondary | ICD-10-CM

## 2021-07-13 DIAGNOSIS — N289 Disorder of kidney and ureter, unspecified: Secondary | ICD-10-CM

## 2021-07-13 NOTE — Progress Notes (Signed)
[facility] [place], [place] [address]  Internal MEDICINE  Office Visit Note  Patient Name: Adam Hurst  [id number]  [id number]  Date of Service: 07/13/2021  Chief Complaint  Patient presents with   Medicare Wellness   Hypertension   Gastroesophageal Reflux   Results    HPI Arlynn presents for an annual well visit and physical exam. He is a well appearing 70 yo male. He has hypertension and gastroesophageal reflux. Both are well controlled. He has a colonoscopy in 2020. He had his labs done prior to his office visit. His thyroid levels and PSA were normal. His A1C is 6.0 which is prediabetic. CBC was grossly normal. He has a chronically elevated creatinine level and GFR is 52. His vitamin D is low normal and his lipid panel his improving. He had a renal ultrasound in 2020 that showed a right renal cyst.     Current Medication: Outpatient Encounter Medications as of 07/13/2021  Medication Sig   acetaminophen (TYLENOL) 500 MG tablet Take 500 mg by mouth every 6 (six) hours as needed for moderate pain or headache.   amLODipine (NORVASC) 10 MG tablet Take 1 tablet (10 mg total) by mouth daily.   aspirin EC 81 MG tablet Take 81 mg by mouth daily.   pantoprazole (PROTONIX) 40 MG tablet Take 1 tablet (40 mg total) by mouth daily.   No facility-administered encounter medications on file as of 07/13/2021.    Surgical History: Past Surgical History:  Procedure Laterality Date   COLONOSCOPY W/ POLYPECTOMY     VASECTOMY Bilateral 1991   XI ROBOTIC ASSISTED VENTRAL HERNIA N/A 03/06/2020   Procedure: XI ROBOTIC ASSISTED VENTRAL HERNIA;  Surgeon: Herbert Pun, MD;  Location: [facility] ORS;  Service: General;  Laterality: N/A;    Medical History: Past Medical History:  Diagnosis Date   GERD (gastroesophageal reflux disease)    Hypertension     Family History: Family History  Problem Relation Age of Onset   Alzheimer's disease Mother    Cancer  Father     Social History   Socioeconomic History   Marital status: Married    Spouse name: Not on file   Number of children: Not on file   Years of education: Not on file   Highest education level: Not on file  Occupational History   Not on file  Tobacco Use   Smoking status: Former    Packs/day: 1.00    Types: Cigarettes    Quit date: 12/12/2020    Years since quitting: 0.6   Smokeless tobacco: Never  Vaping Use   Vaping Use: Never used  Substance and Sexual Activity   Alcohol use: Yes    Comment: rare   Drug use: Never   Sexual activity: Not on file  Other Topics Concern   Not on file  Social History Narrative   Not on file   Social Determinants of Health   Financial Resource Strain: Not on file  Food Insecurity: Not on file  Transportation Needs: Not on file  Physical Activity: Not on file  Stress: Not on file  Social Connections: Not on file  Intimate Partner Violence: Not on file      Review of Systems  Constitutional:  Negative for activity change, appetite change, chills, fatigue, fever and unexpected weight change.  HENT: Negative.  Negative for congestion, ear pain, rhinorrhea, sore throat and trouble swallowing.   Eyes: Negative.   Respiratory: Negative.  Negative for cough, chest tightness, shortness  of breath and wheezing.   Cardiovascular: Negative.  Negative for chest pain.  Gastrointestinal: Negative.  Negative for abdominal pain, blood in stool, constipation, diarrhea, nausea and vomiting.  Endocrine: Negative.   Genitourinary: Negative.  Negative for difficulty urinating, dysuria, frequency, hematuria and urgency.  Musculoskeletal: Negative.  Negative for arthralgias, back pain, joint swelling, myalgias and neck pain.  Skin: Negative.  Negative for rash and wound.  Allergic/Immunologic: Negative.  Negative for immunocompromised state.  Neurological: Negative.  Negative for dizziness, seizures, numbness and headaches.  Hematological: Negative.    Psychiatric/Behavioral: Negative.  Negative for behavioral problems, self-injury and suicidal ideas. The patient is not nervous/anxious.    Vital Signs: BP 140/75   Pulse 61   Temp 98.4 F (36.9 C)   Resp 16   Ht 5\' 8"  (1.727 m)   Wt 165 lb 12.8 oz (75.2 kg)   SpO2 99%   BMI 25.21 kg/m    Physical Exam Vitals reviewed.  Constitutional:      General: He is awake. He is not in acute distress.    Appearance: Normal appearance. He is well-developed, well-groomed and normal weight. He is not ill-appearing or diaphoretic.  HENT:     Head: Normocephalic and atraumatic.     Right Ear: Tympanic membrane, ear canal and external ear normal.     Left Ear: Tympanic membrane, ear canal and external ear normal.     Nose: Nose normal. No congestion or rhinorrhea.     Mouth/Throat:     Mouth: Mucous membranes are moist.     Pharynx: Oropharynx is clear. No oropharyngeal exudate.  Eyes:     General: Lids are normal. Vision grossly intact. Gaze aligned appropriately. No scleral icterus.       Right eye: No discharge.        Left eye: No discharge.     Extraocular Movements: Extraocular movements intact.     Conjunctiva/sclera: Conjunctivae normal.     Pupils: Pupils are equal, round, and reactive to light.     Funduscopic exam:    Right eye: Red reflex present.        Left eye: Red reflex present. Neck:     Thyroid: No thyromegaly.     Vascular: No JVD.     Trachea: No tracheal deviation.  Cardiovascular:     Rate and Rhythm: Normal rate and regular rhythm.     Pulses: Normal pulses.     Heart sounds: Normal heart sounds, S1 normal and S2 normal. No murmur heard.   No friction rub. No gallop.  Pulmonary:     Effort: Pulmonary effort is normal. No respiratory distress.     Breath sounds: Normal breath sounds and air entry. No stridor. No wheezing or rales.  Chest:     Chest wall: No tenderness.  Abdominal:     General: Bowel sounds are normal. There is no distension.      Palpations: Abdomen is soft. There is no shifting dullness, fluid wave, mass or pulsatile mass.     Tenderness: There is no abdominal tenderness. There is no guarding or rebound.  Musculoskeletal:        General: No tenderness or deformity. Normal range of motion.     Cervical back: Normal range of motion and neck supple.     Right lower leg: No edema.     Left lower leg: No edema.  Lymphadenopathy:     Cervical: No cervical adenopathy.  Skin:    General: Skin is warm and  dry.     Capillary Refill: Capillary refill takes less than 2 seconds.     Coloration: Skin is not pale.     Findings: No erythema or rash.  Neurological:     Mental Status: He is alert and oriented to person, place, and time.     Cranial Nerves: No cranial nerve deficit.     Motor: No abnormal muscle tone.     Coordination: Coordination normal.     Deep Tendon Reflexes: Reflexes are normal and symmetric.  Psychiatric:        Mood and Affect: Mood and affect normal.        Behavior: Behavior normal. Behavior is cooperative.        Thought Content: Thought content normal.        Judgment: Judgment normal.       Assessment/Plan: 1. Encounter for general adult medical examination with abnormal findings Age-appropriate preventive screenings and vaccinations discussed, annual physical exam completed. Routine labs for health maintenance were done prior to office visit and results were discussed with patient today. PHM updated.   2. Prediabetes A1C is 6.0. no intervention needed at this time. Encouraged patient to decrease carbs and sugars in his diet.   3. Essential hypertension Stable and well controlled with current medication   4. Vitamin D deficiency Low normal vitamin D level, encouraged patient to take OTC vitamin D supplement of 2000 to 5000 units daily.   5. Abnormal kidney function Renal ultrasound in 2020 stable.   6. Dysuria Routine urinalysis done - UA/M w/rflx Culture, Routine - Microscopic  Examination - Urine Culture, Reflex      General Counseling: Hamid verbalizes understanding of the findings of todays visit and agrees with plan of treatment. I have discussed any further diagnostic evaluation that may be needed or ordered today. We also reviewed his medications today. he has been encouraged to call the office with any questions or concerns that should arise related to todays visit.    Orders Placed This Encounter  Procedures   Microscopic Examination   Urine Culture, Reflex   UA/M w/rflx Culture, Routine    No orders of the defined types were placed in this encounter.   Return in about 3 months (around 10/12/2021) for F/U kindey function, Jackqulyn Mendel PCP.   Total time spent:30 Minutes Time spent includes review of chart, medications, test results, and follow up plan with the patient.   [place] Controlled Substance Database was reviewed by me.  This patient was seen by Jonetta Osgood, FNP-C in collaboration with Dr. Clayborn Bigness as a part of collaborative care agreement.  Analysse Quinonez R. Valetta Fuller, MSN, FNP-C Internal medicine

## 2021-07-18 LAB — UA/M W/RFLX CULTURE, ROUTINE
Bilirubin, UA: NEGATIVE
Glucose, UA: NEGATIVE
Ketones, UA: NEGATIVE
Nitrite, UA: NEGATIVE
Protein,UA: NEGATIVE
RBC, UA: NEGATIVE
Specific Gravity, UA: 1.009 (ref 1.005–1.030)
Urobilinogen, Ur: 0.2 mg/dL (ref 0.2–1.0)
pH, UA: 5.5 (ref 5.0–7.5)

## 2021-07-18 LAB — MICROSCOPIC EXAMINATION
Bacteria, UA: NONE SEEN
Casts: NONE SEEN /lpf
Epithelial Cells (non renal): NONE SEEN /hpf (ref 0–10)
RBC, Urine: NONE SEEN /hpf (ref 0–2)

## 2021-07-18 LAB — URINE CULTURE, REFLEX

## 2021-09-16 ENCOUNTER — Other Ambulatory Visit: Payer: Self-pay | Admitting: Nurse Practitioner

## 2021-09-16 DIAGNOSIS — I10 Essential (primary) hypertension: Secondary | ICD-10-CM

## 2021-09-25 ENCOUNTER — Other Ambulatory Visit: Payer: Self-pay | Admitting: Internal Medicine

## 2021-09-25 DIAGNOSIS — K219 Gastro-esophageal reflux disease without esophagitis: Secondary | ICD-10-CM

## 2021-10-05 ENCOUNTER — Encounter: Payer: Self-pay | Admitting: Nurse Practitioner

## 2021-10-05 ENCOUNTER — Other Ambulatory Visit: Payer: Self-pay

## 2021-10-05 ENCOUNTER — Ambulatory Visit (INDEPENDENT_AMBULATORY_CARE_PROVIDER_SITE_OTHER): Payer: Medicare Other | Admitting: Nurse Practitioner

## 2021-10-05 VITALS — BP 132/80 | HR 65 | Temp 98.5°F | Resp 16 | Ht 68.0 in | Wt 167.0 lb

## 2021-10-05 DIAGNOSIS — I10 Essential (primary) hypertension: Secondary | ICD-10-CM

## 2021-10-05 DIAGNOSIS — N289 Disorder of kidney and ureter, unspecified: Secondary | ICD-10-CM

## 2021-10-05 DIAGNOSIS — N281 Cyst of kidney, acquired: Secondary | ICD-10-CM | POA: Diagnosis not present

## 2021-10-05 DIAGNOSIS — R7303 Prediabetes: Secondary | ICD-10-CM

## 2021-10-05 NOTE — Progress Notes (Signed)
[facility] [place], [place] [address]  Internal MEDICINE  Office Visit Note  Patient Name: Adam Hurst  [id number]  [id number]  Date of Service: 10/05/2021  Chief Complaint  Patient presents with   Follow-up    Kidney function   Gastroesophageal Reflux   Hypertension    HPI Adam Hurst presents for a follow up visit for hypertension and abnormal kidney function. At his previous office visit, his labs were discussed and his serum creatinine was further elevated at 1.46 and GFR was decreased at 52. His last renal ultrasound was stable in 2020 with a simple cyst in the right kidney. It has been approximately 3 years since his last renal ultrasound.  His blood pressure is well controlled with current medication.    Current Medication: Outpatient Encounter Medications as of 10/05/2021  Medication Sig   acetaminophen (TYLENOL) 500 MG tablet Take 500 mg by mouth every 6 (six) hours as needed for moderate pain or headache.   amLODipine (NORVASC) 10 MG tablet TAKE 1 TABLET BY MOUTH EVERY DAY   aspirin EC 81 MG tablet Take 81 mg by mouth daily.   pantoprazole (PROTONIX) 40 MG tablet TAKE 1 TABLET BY MOUTH EVERY DAY   No facility-administered encounter medications on file as of 10/05/2021.    Surgical History: Past Surgical History:  Procedure Laterality Date   COLONOSCOPY W/ POLYPECTOMY     VASECTOMY Bilateral 1991   XI ROBOTIC ASSISTED VENTRAL HERNIA N/A 03/06/2020   Procedure: XI ROBOTIC ASSISTED VENTRAL HERNIA;  Surgeon: Herbert Pun, MD;  Location: [facility] ORS;  Service: General;  Laterality: N/A;    Medical History: Past Medical History:  Diagnosis Date   GERD (gastroesophageal reflux disease)    Hypertension     Family History: Family History  Problem Relation Age of Onset   Alzheimer's disease Mother    Cancer Father     Social History   Socioeconomic History   Marital status: Married    Spouse name: Not on file   Number of  children: Not on file   Years of education: Not on file   Highest education level: Not on file  Occupational History   Not on file  Tobacco Use   Smoking status: Former    Packs/day: 1.00    Types: Cigarettes    Quit date: 12/12/2020    Years since quitting: 0.8   Smokeless tobacco: Never  Vaping Use   Vaping Use: Never used  Substance and Sexual Activity   Alcohol use: Yes    Comment: rare   Drug use: Never   Sexual activity: Not on file  Other Topics Concern   Not on file  Social History Narrative   Not on file   Social Determinants of Health   Financial Resource Strain: Not on file  Food Insecurity: Not on file  Transportation Needs: Not on file  Physical Activity: Not on file  Stress: Not on file  Social Connections: Not on file  Intimate Partner Violence: Not on file      Review of Systems  Constitutional:  Negative for chills, fatigue and unexpected weight change.  HENT:  Negative for congestion, rhinorrhea, sneezing and sore throat.   Eyes:  Negative for redness.  Respiratory:  Negative for cough, chest tightness and shortness of breath.   Cardiovascular:  Negative for chest pain and palpitations.  Gastrointestinal:  Negative for abdominal pain, constipation, diarrhea, nausea and vomiting.  Genitourinary:  Negative for dysuria and frequency.  Musculoskeletal:  Negative for arthralgias, back pain, joint swelling and neck pain.  Skin:  Negative for rash.  Neurological: Negative.  Negative for tremors and numbness.  Hematological:  Negative for adenopathy. Does not bruise/bleed easily.  Psychiatric/Behavioral:  Negative for behavioral problems (Depression), sleep disturbance and suicidal ideas. The patient is not nervous/anxious.    Vital Signs: BP 132/80 Comment: 144/78   Pulse 65    Temp 98.5 F (36.9 C)    Resp 16    Ht 5\' 8"  (1.727 m)    Wt 167 lb (75.8 kg)    SpO2 99%    BMI 25.39 kg/m    Physical Exam Vitals reviewed.  Constitutional:      General:  He is not in acute distress.    Appearance: Normal appearance. He is normal weight. He is not ill-appearing.  HENT:     Head: Normocephalic and atraumatic.  Eyes:     Pupils: Pupils are equal, round, and reactive to light.  Cardiovascular:     Rate and Rhythm: Normal rate and regular rhythm.  Pulmonary:     Effort: Pulmonary effort is normal. No respiratory distress.  Neurological:     Mental Status: He is alert and oriented to person, place, and time.  Psychiatric:        Mood and Affect: Mood normal.        Behavior: Behavior normal.       Assessment/Plan: 1. Abnormal kidney function Repeat CMP in 3 months, repeat renal ultrasound - US Renal; Future  2. Renal cyst, right Repeat renal ultrasound - US Renal; Future  3. Essential hypertension Stable, continue medications as ordered.   4. Prediabetes Last A1C is 6.0, will repeat A1C in 3 months.     General Counseling: Sabastion verbalizes understanding of the findings of todays visit and agrees with plan of treatment. I have discussed any further diagnostic evaluation that may be needed or ordered today. We also reviewed his medications today. he has been encouraged to call the office with any questions or concerns that should arise related to todays visit.    Orders Placed This Encounter  Procedures   US Renal    No orders of the defined types were placed in this encounter.   Return in about 3 months (around 01/02/2022) for F/U, U/S @ [place], Recheck A1C , [place] PCP.   Total time spent:30 Minutes Time spent includes review of chart, medications, test results, and follow up plan with the patient.   [place] Controlled Substance Database was reviewed by me.  This patient was seen by Jonetta Osgood, FNP-C in collaboration with Dr. Clayborn Bigness as a part of collaborative care agreement.   Alexx Giambra R. Valetta Fuller, MSN, FNP-C Internal medicine

## 2021-11-17 ENCOUNTER — Ambulatory Visit (INDEPENDENT_AMBULATORY_CARE_PROVIDER_SITE_OTHER): Payer: Medicare Other

## 2021-11-17 DIAGNOSIS — N289 Disorder of kidney and ureter, unspecified: Secondary | ICD-10-CM

## 2021-11-17 DIAGNOSIS — N281 Cyst of kidney, acquired: Secondary | ICD-10-CM

## 2022-01-04 ENCOUNTER — Ambulatory Visit (INDEPENDENT_AMBULATORY_CARE_PROVIDER_SITE_OTHER): Payer: Medicare Other | Admitting: Nurse Practitioner

## 2022-01-04 ENCOUNTER — Encounter: Payer: Self-pay | Admitting: Nurse Practitioner

## 2022-01-04 VITALS — BP 140/78 | HR 64 | Temp 98.4°F | Resp 16 | Ht 68.0 in | Wt 170.0 lb

## 2022-01-04 DIAGNOSIS — I10 Essential (primary) hypertension: Secondary | ICD-10-CM

## 2022-01-04 DIAGNOSIS — R7303 Prediabetes: Secondary | ICD-10-CM | POA: Diagnosis not present

## 2022-01-04 DIAGNOSIS — K219 Gastro-esophageal reflux disease without esophagitis: Secondary | ICD-10-CM

## 2022-01-04 LAB — POCT GLYCOSYLATED HEMOGLOBIN (HGB A1C): Hemoglobin A1C: 5.7 % — AB (ref 4.0–5.6)

## 2022-01-04 MED ORDER — PANTOPRAZOLE SODIUM 40 MG PO TBEC: 40.0000 mg | DELAYED_RELEASE_TABLET | Freq: Every day | ORAL | 1 refills | Status: DC

## 2022-01-04 MED ORDER — AMLODIPINE BESYLATE 10 MG PO TABS: 10.0000 mg | ORAL_TABLET | Freq: Every day | ORAL | 1 refills | Status: DC

## 2022-01-04 NOTE — Progress Notes (Signed)
[facility] [place], [place] [address]  Internal MEDICINE  Office Visit Note  Patient Name: Adam Hurst  [id number]  [id number]  Date of Service: 01/04/2022  Chief Complaint  Patient presents with   Follow-up   Results   Gastroesophageal Reflux   Hypertension    HPI Adam Hurst presents for follow-up visit for prediabetes, hypertension, and gastroesophageal reflux.  His A1c is 5.7 today which is improved from 6.0 in November last year.  His blood pressure is stable with current medication.  He is due for his annual wellness visit and physical exam in November later this year he is due for refills of his medications and does not need anything else at this time.    Current Medication: Outpatient Encounter Medications as of 01/04/2022  Medication Sig   acetaminophen (TYLENOL) 500 MG tablet Take 500 mg by mouth every 6 (six) hours as needed for moderate pain or headache.   aspirin EC 81 MG tablet Take 81 mg by mouth daily.   [DISCONTINUED] amLODipine (NORVASC) 10 MG tablet TAKE 1 TABLET BY MOUTH EVERY DAY   [DISCONTINUED] pantoprazole (PROTONIX) 40 MG tablet TAKE 1 TABLET BY MOUTH EVERY DAY   amLODipine (NORVASC) 10 MG tablet Take 1 tablet (10 mg total) by mouth daily.   pantoprazole (PROTONIX) 40 MG tablet Take 1 tablet (40 mg total) by mouth daily.   No facility-administered encounter medications on file as of 01/04/2022.    Surgical History: Past Surgical History:  Procedure Laterality Date   COLONOSCOPY W/ POLYPECTOMY     VASECTOMY Bilateral 1991   XI ROBOTIC ASSISTED VENTRAL HERNIA N/A 03/06/2020   Procedure: XI ROBOTIC ASSISTED VENTRAL HERNIA;  Surgeon: Herbert Pun, MD;  Location: [facility] ORS;  Service: General;  Laterality: N/A;    Medical History: Past Medical History:  Diagnosis Date   GERD (gastroesophageal reflux disease)    Hypertension     Family History: Family History  Problem Relation Age of Onset   Alzheimer's disease  Mother    Cancer Father     Social History   Socioeconomic History   Marital status: Married    Spouse name: Not on file   Number of children: Not on file   Years of education: Not on file   Highest education level: Not on file  Occupational History   Not on file  Tobacco Use   Smoking status: Former    Packs/day: 1.00    Types: Cigarettes    Quit date: 12/12/2020    Years since quitting: 1.1   Smokeless tobacco: Never  Vaping Use   Vaping Use: Never used  Substance and Sexual Activity   Alcohol use: Yes    Comment: rare   Drug use: Never   Sexual activity: Not on file  Other Topics Concern   Not on file  Social History Narrative   Not on file   Social Determinants of Health   Financial Resource Strain: Not on file  Food Insecurity: Not on file  Transportation Needs: Not on file  Physical Activity: Not on file  Stress: Not on file  Social Connections: Not on file  Intimate Partner Violence: Not on file      Review of Systems  Constitutional:  Negative for chills, fatigue and unexpected weight change.  HENT:  Negative for congestion, rhinorrhea, sneezing and sore throat.   Eyes:  Negative for redness.  Respiratory:  Negative for cough, chest tightness and shortness of breath.   Cardiovascular:  Negative for  chest pain and palpitations.  Gastrointestinal:  Negative for abdominal pain, constipation, diarrhea, nausea and vomiting.  Genitourinary:  Negative for dysuria and frequency.  Musculoskeletal:  Negative for arthralgias, back pain, joint swelling and neck pain.  Skin:  Negative for rash.  Neurological: Negative.  Negative for tremors and numbness.  Hematological:  Negative for adenopathy. Does not bruise/bleed easily.  Psychiatric/Behavioral:  Negative for behavioral problems (Depression), sleep disturbance and suicidal ideas. The patient is not nervous/anxious.     Vital Signs: BP 140/78   Pulse 64   Temp 98.4 F (36.9 C)   Resp 16   Ht '5\' 8"'$   (1.727 m)   Wt 170 lb (77.1 kg)   SpO2 98%   BMI 25.85 kg/m    Physical Exam Vitals reviewed.  Constitutional:      General: He is not in acute distress.    Appearance: Normal appearance. He is normal weight. He is not ill-appearing.  HENT:     Head: Normocephalic and atraumatic.  Eyes:     Pupils: Pupils are equal, round, and reactive to light.  Cardiovascular:     Rate and Rhythm: Normal rate and regular rhythm.  Pulmonary:     Effort: Pulmonary effort is normal. No respiratory distress.  Neurological:     Mental Status: He is alert and oriented to person, place, and time.  Psychiatric:        Mood and Affect: Mood normal.        Behavior: Behavior normal.        Assessment/Plan: 1. Prediabetes A1c improved to 5.7 today.  Will repeat A1c in November - POCT glycosylated hemoglobin (Hb A1C)  2. Essential hypertension Blood pressure is stable and controlled with current medication, refills ordered. - amLODipine (NORVASC) 10 MG tablet; Take 1 tablet (10 mg total) by mouth daily.  Dispense: 90 tablet; Refill: 1  3. Gastroesophageal reflux disease without esophagitis Acid reflux is controlled with current medication, refills ordered. - pantoprazole (PROTONIX) 40 MG tablet; Take 1 tablet (40 mg total) by mouth daily.  Dispense: 90 tablet; Refill: 1   General Counseling: Adam Hurst verbalizes understanding of the findings of todays visit and agrees with plan of treatment. I have discussed any further diagnostic evaluation that may be needed or ordered today. We also reviewed his medications today. he has been encouraged to call the office with any questions or concerns that should arise related to todays visit.    Orders Placed This Encounter  Procedures   POCT glycosylated hemoglobin (Hb A1C)    Meds ordered this encounter  Medications   amLODipine (NORVASC) 10 MG tablet    Sig: Take 1 tablet (10 mg total) by mouth daily.    Dispense:  90 tablet    Refill:  1    DX  Code Needed  .   pantoprazole (PROTONIX) 40 MG tablet    Sig: Take 1 tablet (40 mg total) by mouth daily.    Dispense:  90 tablet    Refill:  1    DX Code Needed  .    Return for previously scheduled, CPE, Adam Hurst PCP in november. .   Total time spent:30 Minutes Time spent includes review of chart, medications, test results, and follow up plan with the patient.   [place] Controlled Substance Database was reviewed by me.  This patient was seen by Jonetta Osgood, FNP-C in collaboration with Dr. Clayborn Bigness as a part of collaborative care agreement.   Drayk Humbarger R. Valetta Fuller, MSN, FNP-C Internal medicine

## 2022-02-21 ENCOUNTER — Encounter: Payer: Self-pay | Admitting: Nurse Practitioner

## 2022-04-26 ENCOUNTER — Other Ambulatory Visit: Payer: Self-pay

## 2022-04-26 DIAGNOSIS — I10 Essential (primary) hypertension: Secondary | ICD-10-CM

## 2022-04-26 MED ORDER — AMLODIPINE BESYLATE 10 MG PO TABS: 10.0000 mg | ORAL_TABLET | Freq: Every day | ORAL | 1 refills | Status: DC

## 2022-06-13 ENCOUNTER — Ambulatory Visit (INDEPENDENT_AMBULATORY_CARE_PROVIDER_SITE_OTHER): Payer: Medicare Other

## 2022-06-13 DIAGNOSIS — Z23 Encounter for immunization: Secondary | ICD-10-CM

## 2022-06-27 ENCOUNTER — Ambulatory Visit: Payer: Medicare Other | Admitting: Nurse Practitioner

## 2022-07-14 ENCOUNTER — Ambulatory Visit: Payer: Medicare Other | Admitting: Nurse Practitioner

## 2022-08-17 ENCOUNTER — Encounter: Payer: Self-pay | Admitting: Nurse Practitioner

## 2022-08-17 ENCOUNTER — Ambulatory Visit (INDEPENDENT_AMBULATORY_CARE_PROVIDER_SITE_OTHER): Payer: Medicare HMO | Admitting: Nurse Practitioner

## 2022-08-17 VITALS — BP 140/70 | HR 66 | Temp 98.5°F | Resp 16 | Ht 68.0 in | Wt 165.8 lb

## 2022-08-17 DIAGNOSIS — R7303 Prediabetes: Secondary | ICD-10-CM | POA: Diagnosis not present

## 2022-08-17 DIAGNOSIS — Z1212 Encounter for screening for malignant neoplasm of rectum: Secondary | ICD-10-CM | POA: Diagnosis not present

## 2022-08-17 DIAGNOSIS — Z0001 Encounter for general adult medical examination with abnormal findings: Secondary | ICD-10-CM | POA: Diagnosis not present

## 2022-08-17 DIAGNOSIS — Z125 Encounter for screening for malignant neoplasm of prostate: Secondary | ICD-10-CM

## 2022-08-17 DIAGNOSIS — Z760 Encounter for issue of repeat prescription: Secondary | ICD-10-CM | POA: Diagnosis not present

## 2022-08-17 DIAGNOSIS — Z1211 Encounter for screening for malignant neoplasm of colon: Secondary | ICD-10-CM | POA: Diagnosis not present

## 2022-08-17 MED ORDER — AMLODIPINE BESYLATE 10 MG PO TABS: 10.0000 mg | ORAL_TABLET | Freq: Every day | ORAL | 3 refills | Status: DC

## 2022-08-17 MED ORDER — PANTOPRAZOLE SODIUM 40 MG PO TBEC: 40.0000 mg | DELAYED_RELEASE_TABLET | Freq: Every day | ORAL | 3 refills | Status: DC

## 2022-08-17 NOTE — Progress Notes (Signed)
[facility] [place], [place] [address]  Internal MEDICINE  Office Visit Note  Patient Name: Adam Hurst  [id number]  [id number]  Date of Service: 08/17/2022  Chief Complaint  Patient presents with   Medicare Wellness   Gastroesophageal Reflux   Hypertension    HPI Adam Hurst presents for an annual well visit and physical exam.  Well-appearing 72 y.o. male with hypertension Routine CRC screening: due for cologuard test  Labs: due for routine labs, will check PSA New or worsening pain:  none Other concerns: none      08/17/2022    9:15 AM 07/13/2021    9:26 AM 07/07/2020    9:28 AM  MMSE - Mini Mental State Exam  Orientation to time _0 Orientation to Place _1 Registration _2 Attention/ Calculation _3 Recall _4 Language- name 2 objects _5 Language- repeat _6 Language- follow 3 step command _7 Language- read & follow direction _8 Write a sentence _9 Copy design _10 Total score _11 Functional Status Survey: Is the patient deaf or have difficulty hearing?: Yes Does the patient have difficulty seeing, even when wearing glasses/contacts?: No Does the patient have difficulty concentrating, remembering, or making decisions?: No Does the patient have difficulty walking or climbing stairs?: No Does the patient have difficulty dressing or bathing?: No Does the patient have difficulty doing errands alone such as visiting a doctor's office or shopping?: No     05/10/2021    9:18 AM 07/13/2021    9:22 AM 10/05/2021    9:44 AM 01/04/2022    9:28 AM 08/17/2022    9:14 AM  Fall Risk  Falls in the past year?  0 0 0 0  Was there an injury with Fall?     0  Fall Risk Category Calculator     0  Fall Risk Category     Low  Patient Fall Risk Level _12   Patient at Risk for Falls Due to  No Fall Risks No Fall Risks No Fall Risks No Fall Risks   Fall risk Follow up  Falls evaluation completed Falls evaluation completed Falls evaluation completed Falls evaluation completed       08/17/2022    9:14 AM  Depression screen PHQ 2/9  Decreased Interest 0  Down, Depressed, Hopeless 0  PHQ - 2 Score 0        Current Medication: Outpatient Encounter Medications as of 08/17/2022  Medication Sig   acetaminophen (TYLENOL) 500 MG tablet Take 500 mg by mouth every 6 (six) hours as needed for moderate pain or headache.   aspirin EC 81 MG tablet Take 81 mg by mouth daily.   [DISCONTINUED] amLODipine (NORVASC) 10 MG tablet Take 1 tablet (10 mg total) by mouth daily.   [DISCONTINUED] pantoprazole (PROTONIX) 40 MG tablet Take 1 tablet (40 mg total) by mouth daily.   amLODipine (NORVASC) 10 MG tablet Take 1 tablet (10 mg total) by mouth daily.   pantoprazole (PROTONIX) 40 MG tablet Take 1 tablet (40 mg total) by mouth daily.   No facility-administered encounter medications on file as of 08/17/2022.    Surgical History: Past Surgical History:  Procedure Laterality Date   COLONOSCOPY W/ POLYPECTOMY  VASECTOMY Bilateral 1991   XI ROBOTIC ASSISTED VENTRAL HERNIA N/A 03/06/2020   Procedure: XI ROBOTIC ASSISTED VENTRAL HERNIA;  Surgeon: Herbert Pun, MD;  Location: [facility] ORS;  Service: General;  Laterality: N/A;    Medical History: Past Medical History:  Diagnosis Date   GERD (gastroesophageal reflux disease)    Hypertension     Family History: Family History  Problem Relation Age of Onset   Alzheimer's disease Mother    Cancer Father     Social History   Socioeconomic History   Marital status: Married    Spouse name: Not on file   Number of children: Not on file   Years of education: Not on file   Highest education level: Not on file  Occupational History   Not on file  Tobacco Use   Smoking status: Former    Packs/day: 1.00    Types: Cigarettes    Quit date: 12/12/2020    Years since quitting: 1.6   Smokeless  tobacco: Never  Vaping Use   Vaping Use: Never used  Substance and Sexual Activity   Alcohol use: Yes    Comment: rare   Drug use: Never   Sexual activity: Not on file  Other Topics Concern   Not on file  Social History Narrative   Not on file   Social Determinants of Health   Financial Resource Strain: Not on file  Food Insecurity: Not on file  Transportation Needs: Not on file  Physical Activity: Not on file  Stress: Not on file  Social Connections: Not on file  Intimate Partner Violence: Not on file      Review of Systems  Constitutional:  Negative for activity change, appetite change, chills, fatigue, fever and unexpected weight change.  HENT: Negative.  Negative for congestion, ear pain, rhinorrhea, sore throat and trouble swallowing.   Eyes: Negative.   Respiratory: Negative.  Negative for cough, chest tightness, shortness of breath and wheezing.   Cardiovascular: Negative.  Negative for chest pain.  Gastrointestinal: Negative.  Negative for abdominal pain, blood in stool, constipation, diarrhea, nausea and vomiting.  Endocrine: Negative.   Genitourinary: Negative.  Negative for difficulty urinating, dysuria, frequency, hematuria and urgency.  Musculoskeletal: Negative.  Negative for arthralgias, back pain, joint swelling, myalgias and neck pain.  Skin: Negative.  Negative for rash and wound.  Allergic/Immunologic: Negative.  Negative for immunocompromised state.  Neurological: Negative.  Negative for dizziness, seizures, numbness and headaches.  Hematological: Negative.   Psychiatric/Behavioral: Negative.  Negative for behavioral problems, self-injury and suicidal ideas. The patient is not nervous/anxious.     Vital Signs: BP (!) 140/70 Comment: 158/85  Pulse 66   Temp 98.5 F (36.9 C)   Resp 16   Ht _0  (1.727 m)   Wt 165 lb 12.8 oz (75.2 kg)   SpO2 99%   BMI 25.21 kg/m    Physical Exam Vitals reviewed.  Constitutional:      General: He is awake.  He is not in acute distress.    Appearance: Normal appearance. He is well-developed, well-groomed and normal weight. He is not ill-appearing or diaphoretic.  HENT:     Head: Normocephalic and atraumatic.     Right Ear: Tympanic membrane, ear canal and external ear normal.     Left Ear: Tympanic membrane, ear canal and external ear normal.     Nose: Nose normal. No congestion or rhinorrhea.     Mouth/Throat:     Mouth: Mucous membranes are moist.  Pharynx: Oropharynx is clear. No oropharyngeal exudate.  Eyes:     General: Lids are normal. Vision grossly intact. Gaze aligned appropriately. No scleral icterus.       Right eye: No discharge.        Left eye: No discharge.     Extraocular Movements: Extraocular movements intact.     Conjunctiva/sclera: Conjunctivae normal.     Pupils: Pupils are equal, round, and reactive to light.     Funduscopic exam:    Right eye: Red reflex present.        Left eye: Red reflex present. Neck:     Thyroid: No thyromegaly.     Vascular: No JVD.     Trachea: No tracheal deviation.  Cardiovascular:     Rate and Rhythm: Normal rate and regular rhythm.     Pulses: Normal pulses.     Heart sounds: Normal heart sounds, S1 normal and S2 normal. No murmur heard.    No friction rub. No gallop.  Pulmonary:     Effort: Pulmonary effort is normal. No respiratory distress.     Breath sounds: Normal breath sounds and air entry. No stridor. No wheezing or rales.  Chest:     Chest wall: No tenderness.  Abdominal:     General: Bowel sounds are normal. There is no distension.     Palpations: Abdomen is soft. There is no shifting dullness, fluid wave, mass or pulsatile mass.     Tenderness: There is no abdominal tenderness. There is no guarding or rebound.  Musculoskeletal:        General: No tenderness or deformity. Normal range of motion.     Cervical back: Normal range of motion and neck supple.     Right lower leg: No edema.     Left lower leg: No edema.   Lymphadenopathy:     Cervical: No cervical adenopathy.  Skin:    General: Skin is warm and dry.     Capillary Refill: Capillary refill takes less than 2 seconds.     Coloration: Skin is not pale.     Findings: No erythema or rash.  Neurological:     Mental Status: He is alert and oriented to person, place, and time.     Cranial Nerves: No cranial nerve deficit.     Motor: No abnormal muscle tone.     Coordination: Coordination normal.     Deep Tendon Reflexes: Reflexes are normal and symmetric.  Psychiatric:        Mood and Affect: Mood and affect normal.        Behavior: Behavior normal. Behavior is cooperative.        Thought Content: Thought content normal.        Judgment: Judgment normal.        Assessment/Plan: 1. Encounter for general adult medical examination with abnormal findings Age-appropriate preventive screenings and vaccinations discussed, annual physical exam completed. Routine labs for health maintenance ordered, see below. PHM updated.  - CBC with Differential/Platelet - CMP14+EGFR - Lipid Profile - Hgb A1C w/o eAG  2. Prediabetes Routine labs ordered, no other interventions recommended at this time.  - CBC with Differential/Platelet - CMP14+EGFR - Lipid Profile - Hgb A1C w/o eAG  3. Screening for colorectal cancer Cologuard test ordered - Cologuard  4. Screening for prostate cancer Routine lab ordered - PSA Total (Reflex To Free)  5. Medication refill - amLODipine (NORVASC) 10 MG tablet; Take 1 tablet (10 mg total) by mouth daily.  Dispense:  90 tablet; Refill: 3 - pantoprazole (PROTONIX) 40 MG tablet; Take 1 tablet (40 mg total) by mouth daily.  Dispense: 90 tablet; Refill: 3      General Counseling: Zymier verbalizes understanding of the findings of todays visit and agrees with plan of treatment. I have discussed any further diagnostic evaluation that may be needed or ordered today. We also reviewed his medications today. he has been  encouraged to call the office with any questions or concerns that should arise related to todays visit.    Orders Placed This Encounter  Procedures   Cologuard   CBC with Differential/Platelet   CMP14+EGFR   Lipid Profile   Hgb A1C w/o eAG   PSA Total (Reflex To Free)    Meds ordered this encounter  Medications   amLODipine (NORVASC) 10 MG tablet    Sig: Take 1 tablet (10 mg total) by mouth daily.    Dispense:  90 tablet    Refill:  3    For future refills   pantoprazole (PROTONIX) 40 MG tablet    Sig: Take 1 tablet (40 mg total) by mouth daily.    Dispense:  90 tablet    Refill:  3    For future refills    Return in about 6 months (around 02/15/2023) for F/U, Clemon Devaul PCP.   Total time spent:30 Minutes Time spent includes review of chart, medications, test results, and follow up plan with the patient.   [place] Controlled Substance Database was reviewed by me.  This patient was seen by Jonetta Osgood, FNP-C in collaboration with Dr. Clayborn Bigness as a part of collaborative care agreement.  Caroleena Paolini R. Valetta Fuller, MSN, FNP-C Internal medicine

## 2022-08-31 DIAGNOSIS — Z1212 Encounter for screening for malignant neoplasm of rectum: Secondary | ICD-10-CM | POA: Diagnosis not present

## 2022-08-31 DIAGNOSIS — Z1211 Encounter for screening for malignant neoplasm of colon: Secondary | ICD-10-CM | POA: Diagnosis not present

## 2022-09-05 DIAGNOSIS — K219 Gastro-esophageal reflux disease without esophagitis: Secondary | ICD-10-CM | POA: Diagnosis not present

## 2022-09-05 DIAGNOSIS — I10 Essential (primary) hypertension: Secondary | ICD-10-CM | POA: Diagnosis not present

## 2022-09-05 DIAGNOSIS — Z0001 Encounter for general adult medical examination with abnormal findings: Secondary | ICD-10-CM | POA: Diagnosis not present

## 2022-09-05 DIAGNOSIS — Z125 Encounter for screening for malignant neoplasm of prostate: Secondary | ICD-10-CM | POA: Diagnosis not present

## 2022-09-05 DIAGNOSIS — R7303 Prediabetes: Secondary | ICD-10-CM | POA: Diagnosis not present

## 2022-09-06 LAB — LIPID PANEL
Chol/HDL Ratio: 3.4 ratio (ref 0.0–5.0)
Cholesterol, Total: 192 mg/dL (ref 100–199)
HDL: 56 mg/dL (ref >39)
LDL Chol Calc (NIH): 119 mg/dL — ABNORMAL HIGH (ref 0–99)
Triglycerides: 94 mg/dL (ref 0–149)
VLDL Cholesterol Cal: 17 mg/dL (ref 5–40)

## 2022-09-06 LAB — CMP14+EGFR
ALT: 13 IU/L (ref 0–44)
AST: 12 IU/L (ref 0–40)
Albumin/Globulin Ratio: 2.1 (ref 1.2–2.2)
Albumin: 4.4 g/dL (ref 3.9–4.9)
Alkaline Phosphatase: 64 IU/L (ref 44–121)
BUN/Creatinine Ratio: 11 (ref 10–24)
BUN: 16 mg/dL (ref 8–27)
Bilirubin Total: 1.1 mg/dL (ref 0.0–1.2)
CO2: 26 mmol/L (ref 20–29)
Calcium: 9.6 mg/dL (ref 8.6–10.2)
Chloride: 103 mmol/L (ref 96–106)
Creatinine, Ser: 1.5 mg/dL — ABNORMAL HIGH (ref 0.76–1.27)
Globulin, Total: 2.1 g/dL (ref 1.5–4.5)
Glucose: 106 mg/dL — ABNORMAL HIGH (ref 70–99)
Potassium: 4.8 mmol/L (ref 3.5–5.2)
Sodium: 141 mmol/L (ref 134–144)
Total Protein: 6.5 g/dL (ref 6.0–8.5)
eGFR: 50 mL/min/{1.73_m2} — ABNORMAL LOW (ref >59)

## 2022-09-06 LAB — CBC WITH DIFFERENTIAL/PLATELET
Basophils Absolute: 0.1 10*3/uL (ref 0.0–0.2)
Basos: 1 %
EOS (ABSOLUTE): 0.1 10*3/uL (ref 0.0–0.4)
Eos: 1 %
Hematocrit: 46.3 % (ref 37.5–51.0)
Hemoglobin: 15.2 g/dL (ref 13.0–17.7)
Immature Grans (Abs): 0 10*3/uL (ref 0.0–0.1)
Immature Granulocytes: 0 %
Lymphocytes Absolute: 2.1 10*3/uL (ref 0.7–3.1)
Lymphs: 20 %
MCH: 31.3 pg (ref 26.6–33.0)
MCHC: 32.8 g/dL (ref 31.5–35.7)
MCV: 96 fL (ref 79–97)
Monocytes Absolute: 0.9 10*3/uL (ref 0.1–0.9)
Monocytes: 8 %
Neutrophils Absolute: 7.6 10*3/uL — ABNORMAL HIGH (ref 1.4–7.0)
Neutrophils: 70 %
Platelets: 314 10*3/uL (ref 150–450)
RBC: 4.85 x10E6/uL (ref 4.14–5.80)
RDW: 11.7 % (ref 11.6–15.4)
WBC: 10.8 10*3/uL (ref 3.4–10.8)

## 2022-09-06 LAB — HGB A1C W/O EAG: Hgb A1c MFr Bld: 6.2 % — ABNORMAL HIGH (ref 4.8–5.6)

## 2022-09-06 LAB — PSA TOTAL (REFLEX TO FREE): Prostate Specific Ag, Serum: 0.7 ng/mL (ref 0.0–4.0)

## 2022-09-08 LAB — COLOGUARD: COLOGUARD: NEGATIVE

## 2022-09-15 NOTE — Progress Notes (Signed)
Please call patient with results: --cologuard is negative, repeat in 3 years. --kidney function remains decreased but stable, will continue to watch and monitor  --cholesterol is normal except for elevated LDL of 119 which has improved from 2022 --A1c is elevated at 6.2 still prediabetic --PSA is normal

## 2023-01-04 DIAGNOSIS — D485 Neoplasm of uncertain behavior of skin: Secondary | ICD-10-CM | POA: Diagnosis not present

## 2023-01-04 DIAGNOSIS — L578 Other skin changes due to chronic exposure to nonionizing radiation: Secondary | ICD-10-CM | POA: Diagnosis not present

## 2023-01-04 DIAGNOSIS — Z85828 Personal history of other malignant neoplasm of skin: Secondary | ICD-10-CM | POA: Diagnosis not present

## 2023-01-04 DIAGNOSIS — L570 Actinic keratosis: Secondary | ICD-10-CM | POA: Diagnosis not present

## 2023-01-04 DIAGNOSIS — Z08 Encounter for follow-up examination after completed treatment for malignant neoplasm: Secondary | ICD-10-CM | POA: Diagnosis not present

## 2023-01-04 DIAGNOSIS — C44319 Basal cell carcinoma of skin of other parts of face: Secondary | ICD-10-CM | POA: Diagnosis not present

## 2023-02-08 DIAGNOSIS — L814 Other melanin hyperpigmentation: Secondary | ICD-10-CM | POA: Diagnosis not present

## 2023-02-08 DIAGNOSIS — C44319 Basal cell carcinoma of skin of other parts of face: Secondary | ICD-10-CM | POA: Diagnosis not present

## 2023-02-08 DIAGNOSIS — L578 Other skin changes due to chronic exposure to nonionizing radiation: Secondary | ICD-10-CM | POA: Diagnosis not present

## 2023-02-08 DIAGNOSIS — L988 Other specified disorders of the skin and subcutaneous tissue: Secondary | ICD-10-CM | POA: Diagnosis not present

## 2023-02-15 ENCOUNTER — Encounter: Payer: Self-pay | Admitting: Nurse Practitioner

## 2023-02-15 ENCOUNTER — Ambulatory Visit (INDEPENDENT_AMBULATORY_CARE_PROVIDER_SITE_OTHER): Payer: Medicare HMO | Admitting: Nurse Practitioner

## 2023-02-15 VITALS — BP 134/78 | HR 60 | Temp 98.7°F | Resp 16 | Ht 68.0 in | Wt 162.4 lb

## 2023-02-15 DIAGNOSIS — R7303 Prediabetes: Secondary | ICD-10-CM | POA: Diagnosis not present

## 2023-02-15 DIAGNOSIS — K219 Gastro-esophageal reflux disease without esophagitis: Secondary | ICD-10-CM | POA: Diagnosis not present

## 2023-02-15 DIAGNOSIS — I10 Essential (primary) hypertension: Secondary | ICD-10-CM | POA: Diagnosis not present

## 2023-02-15 NOTE — Progress Notes (Signed)
[facility] [address] [place], [place] [address]  Internal MEDICINE  Office Visit Note  Patient Name: Adam Hurst  [id number]  [id number]  Date of Service: 02/15/2023  Chief Complaint  Patient presents with   Gastroesophageal Reflux   Hypertension   Follow-up    HPI Jabriel presents for a follow-up visit for GERD, hypertension, and prediabetes  GERD -- controlled with pantoprazole Hypertension -- controlled with amlodipine  Prediabetes -- last A1c was 6.2, diet controlled, no issues      Current Medication: Outpatient Encounter Medications as of 02/15/2023  Medication Sig   acetaminophen (TYLENOL) 500 MG tablet Take 500 mg by mouth every 6 (six) hours as needed for moderate pain or headache.   amLODipine (NORVASC) 10 MG tablet Take 1 tablet (10 mg total) by mouth daily.   aspirin EC 81 MG tablet Take 81 mg by mouth daily.   pantoprazole (PROTONIX) 40 MG tablet Take 1 tablet (40 mg total) by mouth daily.   No facility-administered encounter medications on file as of 02/15/2023.    Surgical History: Past Surgical History:  Procedure Laterality Date   COLONOSCOPY W/ POLYPECTOMY     VASECTOMY Bilateral 1991   XI ROBOTIC ASSISTED VENTRAL HERNIA N/A 03/06/2020   Procedure: XI ROBOTIC ASSISTED VENTRAL HERNIA;  Surgeon: Carolan Shiver, MD;  Location: [facility] ORS;  Service: General;  Laterality: N/A;    Medical History: Past Medical History:  Diagnosis Date   GERD (gastroesophageal reflux disease)    Hypertension     Family History: Family History  Problem Relation Age of Onset   Alzheimer's disease Mother    Cancer Father     Social History   Socioeconomic History   Marital status: Married    Spouse name: Not on file   Number of children: Not on file   Years of education: Not on file   Highest education level: Not on file  Occupational History   Not on file  Tobacco Use   Smoking status: Former    Packs/day: 1    Types: Cigarettes     Quit date: 12/12/2020    Years since quitting: 2.1   Smokeless tobacco: Never  Vaping Use   Vaping Use: Never used  Substance and Sexual Activity   Alcohol use: Yes    Comment: rare   Drug use: Never   Sexual activity: Not on file  Other Topics Concern   Not on file  Social History Narrative   Not on file   Social Determinants of Health   Financial Resource Strain: Not on file  Food Insecurity: Not on file  Transportation Needs: Not on file  Physical Activity: Not on file  Stress: Not on file  Social Connections: Not on file  Intimate Partner Violence: Not on file      Review of Systems  Constitutional:  Negative for chills, fatigue and unexpected weight change.  HENT:  Negative for congestion, rhinorrhea, sneezing and sore throat.   Eyes:  Negative for redness.  Respiratory:  Negative for cough, chest tightness and shortness of breath.   Cardiovascular:  Negative for chest pain and palpitations.  Gastrointestinal:  Negative for abdominal pain, constipation, diarrhea, nausea and vomiting.  Genitourinary:  Negative for dysuria and frequency.  Musculoskeletal:  Negative for arthralgias, back pain, joint swelling and neck pain.  Skin:  Negative for rash.  Neurological: Negative.  Negative for tremors and numbness.  Hematological:  Negative for adenopathy. Does not bruise/bleed easily.  Psychiatric/Behavioral:  Negative for  behavioral problems (Depression), sleep disturbance and suicidal ideas. The patient is not nervous/anxious.     Vital Signs: BP 134/78 Comment: 150/78  Pulse 60   Temp 98.7 F (37.1 C)   Resp 16   Ht 5\' 8"  (1.727 m)   Wt 162 lb 6.4 oz (73.7 kg)   SpO2 95%   BMI 24.69 kg/m    Physical Exam Vitals reviewed.  Constitutional:      General: He is not in acute distress.    Appearance: Normal appearance. He is normal weight. He is not ill-appearing.  HENT:     Head: Normocephalic and atraumatic.  Eyes:     Pupils: Pupils are equal, round, and  reactive to light.  Cardiovascular:     Rate and Rhythm: Normal rate and regular rhythm.  Pulmonary:     Effort: Pulmonary effort is normal. No respiratory distress.  Neurological:     Mental Status: He is alert and oriented to person, place, and time.  Psychiatric:        Mood and Affect: Mood normal.        Behavior: Behavior normal.        Assessment/Plan: 1. Essential hypertension Continue amlodipine as prescribed.   2. Prediabetes Continue diet and lifestyle modifications as previously discussed.   3. Gastroesophageal reflux disease without esophagitis Continue pantoprazole as prescribed and avoid triggers.    General Counseling: Nayib verbalizes understanding of the findings of todays visit and agrees with plan of treatment. I have discussed any further diagnostic evaluation that may be needed or ordered today. We also reviewed his medications today. he has been encouraged to call the office with any questions or concerns that should arise related to todays visit.    No orders of the defined types were placed in this encounter.   No orders of the defined types were placed in this encounter.   Return for previously scheduled, AWV, Hurbert Duran PCP in january 2025. Marland Kitchen   Total time spent:30 Minutes Time spent includes review of chart, medications, test results, and follow up plan with the patient.   [place] Controlled Substance Database was reviewed by me.  This patient was seen by Sallyanne Kuster, FNP-C in collaboration with Dr. Beverely Risen as a part of collaborative care agreement.   Isair Inabinet R. Tedd Sias, MSN, FNP-C Internal medicine

## 2023-05-02 DIAGNOSIS — H2513 Age-related nuclear cataract, bilateral: Secondary | ICD-10-CM | POA: Diagnosis not present

## 2023-05-02 DIAGNOSIS — H11153 Pinguecula, bilateral: Secondary | ICD-10-CM | POA: Diagnosis not present

## 2023-05-02 DIAGNOSIS — Z0100 Encounter for examination of eyes and vision without abnormal findings: Secondary | ICD-10-CM | POA: Diagnosis not present

## 2023-05-02 DIAGNOSIS — H43811 Vitreous degeneration, right eye: Secondary | ICD-10-CM | POA: Diagnosis not present

## 2023-05-07 ENCOUNTER — Other Ambulatory Visit: Payer: Self-pay | Admitting: Nurse Practitioner

## 2023-05-07 DIAGNOSIS — Z760 Encounter for issue of repeat prescription: Secondary | ICD-10-CM

## 2023-05-17 DIAGNOSIS — D485 Neoplasm of uncertain behavior of skin: Secondary | ICD-10-CM | POA: Diagnosis not present

## 2023-05-17 DIAGNOSIS — L578 Other skin changes due to chronic exposure to nonionizing radiation: Secondary | ICD-10-CM | POA: Diagnosis not present

## 2023-05-17 DIAGNOSIS — C44319 Basal cell carcinoma of skin of other parts of face: Secondary | ICD-10-CM | POA: Diagnosis not present

## 2023-05-17 DIAGNOSIS — Z08 Encounter for follow-up examination after completed treatment for malignant neoplasm: Secondary | ICD-10-CM | POA: Diagnosis not present

## 2023-05-17 DIAGNOSIS — L570 Actinic keratosis: Secondary | ICD-10-CM | POA: Diagnosis not present

## 2023-05-17 DIAGNOSIS — X32XXXA Exposure to sunlight, initial encounter: Secondary | ICD-10-CM | POA: Diagnosis not present

## 2023-05-17 DIAGNOSIS — Z85828 Personal history of other malignant neoplasm of skin: Secondary | ICD-10-CM | POA: Diagnosis not present

## 2023-08-03 DIAGNOSIS — C44319 Basal cell carcinoma of skin of other parts of face: Secondary | ICD-10-CM | POA: Diagnosis not present

## 2023-08-23 ENCOUNTER — Encounter: Payer: Self-pay | Admitting: Nurse Practitioner

## 2023-08-23 ENCOUNTER — Ambulatory Visit: Payer: Medicare Other | Admitting: Nurse Practitioner

## 2023-08-23 VITALS — BP 135/68 | HR 70 | Temp 98.3°F | Resp 16 | Ht 68.0 in | Wt 170.6 lb

## 2023-08-23 DIAGNOSIS — R7303 Prediabetes: Secondary | ICD-10-CM

## 2023-08-23 DIAGNOSIS — K219 Gastro-esophageal reflux disease without esophagitis: Secondary | ICD-10-CM | POA: Diagnosis not present

## 2023-08-23 DIAGNOSIS — Z0000 Encounter for general adult medical examination without abnormal findings: Secondary | ICD-10-CM | POA: Diagnosis not present

## 2023-08-23 DIAGNOSIS — I10 Essential (primary) hypertension: Secondary | ICD-10-CM | POA: Diagnosis not present

## 2023-08-23 MED ORDER — AMLODIPINE BESYLATE 10 MG PO TABS: 10.0000 mg | ORAL_TABLET | Freq: Every day | ORAL | 3 refills | Status: DC

## 2023-08-23 MED ORDER — PANTOPRAZOLE SODIUM 40 MG PO TBEC: 40.0000 mg | DELAYED_RELEASE_TABLET | Freq: Every day | ORAL | 3 refills | Status: DC

## 2023-08-23 NOTE — Progress Notes (Signed)
 [facility] [address] [place], [place] [address]  Internal MEDICINE  Office Visit Note  Patient Name: Adam Hurst  [id number]  [id number]  Date of Service: 08/23/2023  Chief Complaint  Patient presents with   Gastroesophageal Reflux   Hypertension   Medicare Wellness    HPI Adam Hurst presents for a medicare annual wellness visit.   Well-appearing 73 y.o. male with hypertension and GERD. His only medications are baby aspirin, amlodipine  and pantoprazole .  Routine CRC screening: cologuard due in 2027 Eye exam: [facility], goes regularly, went last year.  foot exam: done  Labs: due for rotuine labs  New or worsening pain: none  Other concerns: none      08/23/2023    9:06 AM 08/17/2022    9:15 AM 07/13/2021    9:26 AM  MMSE - Mini Mental State Exam  Orientation to time 5 5 5   Orientation to Place 5 5 5   Registration 3 3 3   Attention/ Calculation 5 5 5   Recall 3 3 3   Language- name 2 objects 2 2 2   Language- repeat 1 1 1   Language- follow 3 step command 3 3 3   Language- read & follow direction 1 1 1   Write a sentence 1 1 1   Copy design 1 1 1   Total score 30 30 30     Functional Status Survey: Is the patient deaf or have difficulty hearing?: No Does the patient have difficulty seeing, even when wearing glasses/contacts?: No Does the patient have difficulty concentrating, remembering, or making decisions?: No Does the patient have difficulty walking or climbing stairs?: No Does the patient have difficulty dressing or bathing?: No Does the patient have difficulty doing errands alone such as visiting a doctor's office or shopping?: No     10/05/2021    9:44 AM 01/04/2022    9:28 AM 08/17/2022    9:14 AM 02/15/2023    8:44 AM 08/23/2023    9:05 AM  Fall Risk  Falls in the past year? 0 0 0 0 0  Was there an injury with Fall?   0 0 0  Fall Risk Category Calculator   0 0 0  Fall Risk Category (Retired)   Low    (RETIRED) Patient Fall Risk Level Low  fall risk Low fall risk Low fall risk    Patient at Risk for Falls Due to No Fall Risks No Fall Risks No Fall Risks No Fall Risks No Fall Risks  Fall risk Follow up Falls evaluation completed Falls evaluation completed Falls evaluation completed Falls evaluation completed Falls evaluation completed       08/23/2023    9:05 AM  Depression screen PHQ 2/9  Decreased Interest 0  Down, Depressed, Hopeless 0  PHQ - 2 Score 0       Current Medication: Outpatient Encounter Medications as of 08/23/2023  Medication Sig   acetaminophen  (TYLENOL ) 500 MG tablet Take 500 mg by mouth every 6 (six) hours as needed for moderate pain or headache.   aspirin EC 81 MG tablet Take 81 mg by mouth daily.   [DISCONTINUED] amLODipine  (NORVASC ) 10 MG tablet Take 1 tablet (10 mg total) by mouth daily.   [DISCONTINUED] pantoprazole  (PROTONIX ) 40 MG tablet TAKE 1 TABLET BY MOUTH EVERY DAY   amLODipine  (NORVASC ) 10 MG tablet Take 1 tablet (10 mg total) by mouth daily.   pantoprazole  (PROTONIX ) 40 MG tablet Take 1 tablet (40 mg total) by mouth daily.   No facility-administered encounter medications on  file as of 08/23/2023.    Surgical History: Past Surgical History:  Procedure Laterality Date   COLONOSCOPY W/ POLYPECTOMY     VASECTOMY Bilateral 1991   XI ROBOTIC ASSISTED VENTRAL HERNIA N/A 03/06/2020   Procedure: XI ROBOTIC ASSISTED VENTRAL HERNIA;  Surgeon: Rodolph Romano, MD;  Location: [facility] ORS;  Service: General;  Laterality: N/A;    Medical History: Past Medical History:  Diagnosis Date   GERD (gastroesophageal reflux disease)    Hypertension     Family History: Family History  Problem Relation Age of Onset   Alzheimer's disease Mother    Cancer Father     Social History   Socioeconomic History   Marital status: Married    Spouse name: Not on file   Number of children: Not on file   Years of education: Not on file   Highest education level: Not on file  Occupational History   Not  on file  Tobacco Use   Smoking status: Former    Current packs/day: 0.00    Types: Cigarettes    Quit date: 12/12/2020    Years since quitting: 2.6   Smokeless tobacco: Never  Vaping Use   Vaping status: Never Used  Substance and Sexual Activity   Alcohol use: Yes    Comment: rare   Drug use: Never   Sexual activity: Not on file  Other Topics Concern   Not on file  Social History Narrative   Not on file   Social Drivers of Health   Financial Resource Strain: Not on file  Food Insecurity: Not on file  Transportation Needs: Not on file  Physical Activity: Not on file  Stress: Not on file  Social Connections: Not on file  Intimate Partner Violence: Not on file      Review of Systems  Constitutional:  Negative for activity change, appetite change, chills, fatigue, fever and unexpected weight change.  HENT: Negative.  Negative for congestion, ear pain, rhinorrhea, sore throat and trouble swallowing.   Eyes: Negative.   Respiratory: Negative.  Negative for cough, chest tightness, shortness of breath and wheezing.   Cardiovascular: Negative.  Negative for chest pain.  Gastrointestinal: Negative.  Negative for abdominal pain, blood in stool, constipation, diarrhea, nausea and vomiting.  Endocrine: Negative.   Genitourinary: Negative.  Negative for difficulty urinating, dysuria, frequency, hematuria and urgency.  Musculoskeletal: Negative.  Negative for arthralgias, back pain, joint swelling, myalgias and neck pain.  Skin: Negative.  Negative for rash and wound.  Allergic/Immunologic: Negative.  Negative for immunocompromised state.  Neurological: Negative.  Negative for dizziness, seizures, numbness and headaches.  Hematological: Negative.   Psychiatric/Behavioral: Negative.  Negative for behavioral problems, self-injury and suicidal ideas. The patient is not nervous/anxious.     Vital Signs: BP 135/68 Comment: 170/80  Pulse 70   Temp 98.3 F (36.8 C)   Resp 16   Ht 5'  8 (1.727 m)   Wt 170 lb 9.6 oz (77.4 kg)   SpO2 98%   BMI 25.94 kg/m    Physical Exam Vitals reviewed.  Constitutional:      General: He is awake. He is not in acute distress.    Appearance: Normal appearance. He is well-developed, well-groomed and normal weight. He is not ill-appearing or diaphoretic.  HENT:     Head: Normocephalic and atraumatic.     Right Ear: Tympanic membrane, ear canal and external ear normal.     Left Ear: Tympanic membrane, ear canal and external ear normal.  Nose: Nose normal. No congestion or rhinorrhea.     Mouth/Throat:     Mouth: Mucous membranes are moist.     Pharynx: Oropharynx is clear. No oropharyngeal exudate.  Eyes:     General: Lids are normal. Vision grossly intact. Gaze aligned appropriately. No scleral icterus.       Right eye: No discharge.        Left eye: No discharge.     Extraocular Movements: Extraocular movements intact.     Conjunctiva/sclera: Conjunctivae normal.     Pupils: Pupils are equal, round, and reactive to light.     Funduscopic exam:    Right eye: Red reflex present.        Left eye: Red reflex present. Neck:     Thyroid : No thyromegaly.     Vascular: No JVD.     Trachea: No tracheal deviation.  Cardiovascular:     Rate and Rhythm: Normal rate and regular rhythm.     Pulses: Normal pulses.          Dorsalis pedis pulses are 2+ on the right side and 2+ on the left side.       Posterior tibial pulses are 2+ on the right side and 2+ on the left side.     Heart sounds: Normal heart sounds, S1 normal and S2 normal. No murmur heard.    No friction rub. No gallop.  Pulmonary:     Effort: Pulmonary effort is normal. No respiratory distress.     Breath sounds: Normal breath sounds and air entry. No stridor. No wheezing or rales.  Chest:     Chest wall: No tenderness.  Abdominal:     General: Bowel sounds are normal. There is no distension.     Palpations: Abdomen is soft. There is no shifting dullness, fluid wave,  mass or pulsatile mass.     Tenderness: There is no abdominal tenderness. There is no guarding or rebound.  Musculoskeletal:        General: No tenderness or deformity. Normal range of motion.     Cervical back: Normal range of motion and neck supple.     Right lower leg: No edema.     Left lower leg: No edema.     Right foot: Normal range of motion. No deformity, bunion, Charcot foot, foot drop or prominent metatarsal heads.     Left foot: Normal range of motion. No deformity, bunion, Charcot foot, foot drop or prominent metatarsal heads.  Feet:     Right foot:     Protective Sensation: 6 sites tested.  6 sites sensed.     Skin integrity: Dry skin present. No ulcer, blister, skin breakdown, erythema, warmth, callus or fissure.     Toenail Condition: Right toenails are abnormally thick.     Left foot:     Protective Sensation: 6 sites tested.  6 sites sensed.     Skin integrity: Dry skin present. No ulcer, blister, skin breakdown, erythema, warmth, callus or fissure.     Toenail Condition: Left toenails are abnormally thick.  Lymphadenopathy:     Cervical: No cervical adenopathy.  Skin:    General: Skin is warm and dry.     Capillary Refill: Capillary refill takes less than 2 seconds.     Coloration: Skin is not pale.     Findings: No erythema or rash.  Neurological:     Mental Status: He is alert and oriented to person, place, and time.     Cranial  Nerves: No cranial nerve deficit.     Motor: No abnormal muscle tone.     Coordination: Coordination normal.     Deep Tendon Reflexes: Reflexes are normal and symmetric.  Psychiatric:        Mood and Affect: Mood and affect normal.        Behavior: Behavior normal. Behavior is cooperative.        Thought Content: Thought content normal.        Judgment: Judgment normal.        Assessment/Plan: 1. Encounter for subsequent annual wellness visit (AWV) in Medicare patient (Primary) Age-appropriate preventive screenings and  vaccinations discussed. Routine labs for health maintenance will be ordered, requisition form given to patient. PHM updated.    2. Prediabetes Will repeat labs including A1c  3. Essential hypertension Stable, continue amlodipine  as prescribed.  - amLODipine  (NORVASC ) 10 MG tablet; Take 1 tablet (10 mg total) by mouth daily.  Dispense: 90 tablet; Refill: 3  4. Gastroesophageal reflux disease without esophagitis Stable, continue pantoprazole  as prescribed.  - pantoprazole  (PROTONIX ) 40 MG tablet; Take 1 tablet (40 mg total) by mouth daily.  Dispense: 90 tablet; Refill: 3    General Counseling: Niki verbalizes understanding of the findings of todays visit and agrees with plan of treatment. I have discussed any further diagnostic evaluation that may be needed or ordered today. We also reviewed his medications today. he has been encouraged to call the office with any questions or concerns that should arise related to todays visit.    No orders of the defined types were placed in this encounter.   Meds ordered this encounter  Medications   amLODipine  (NORVASC ) 10 MG tablet    Sig: Take 1 tablet (10 mg total) by mouth daily.    Dispense:  90 tablet    Refill:  3    For future refills, keep on file   pantoprazole  (PROTONIX ) 40 MG tablet    Sig: Take 1 tablet (40 mg total) by mouth daily.    Dispense:  90 tablet    Refill:  3    For future refills, keep on file    Return in about 6 months (around 02/20/2024) for F/U, Ami Mally PCP.   Total time spent:30 Minutes Time spent includes review of chart, medications, test results, and follow up plan with the patient.   [place] Controlled Substance Database was reviewed by me.  This patient was seen by Mardy Maxin, FNP-C in collaboration with Dr. Sigrid Bathe as a part of collaborative care agreement.  Bisma Klett R. Maxin, MSN, FNP-C Internal medicine

## 2023-10-18 ENCOUNTER — Other Ambulatory Visit: Payer: Self-pay | Admitting: Nurse Practitioner

## 2023-10-18 DIAGNOSIS — R7303 Prediabetes: Secondary | ICD-10-CM | POA: Diagnosis not present

## 2023-10-18 DIAGNOSIS — E782 Mixed hyperlipidemia: Secondary | ICD-10-CM | POA: Diagnosis not present

## 2023-10-18 DIAGNOSIS — I10 Essential (primary) hypertension: Secondary | ICD-10-CM | POA: Diagnosis not present

## 2023-10-18 DIAGNOSIS — N289 Disorder of kidney and ureter, unspecified: Secondary | ICD-10-CM | POA: Diagnosis not present

## 2023-10-19 LAB — COMPREHENSIVE METABOLIC PANEL
ALT: 12 IU/L (ref 0–44)
AST: 16 IU/L (ref 0–40)
Albumin: 4.2 g/dL (ref 3.8–4.8)
Alkaline Phosphatase: 58 IU/L (ref 44–121)
BUN/Creatinine Ratio: 11 (ref 10–24)
BUN: 18 mg/dL (ref 8–27)
Bilirubin Total: 1.4 mg/dL — ABNORMAL HIGH (ref 0.0–1.2)
CO2: 24 mmol/L (ref 20–29)
Calcium: 9.1 mg/dL (ref 8.6–10.2)
Chloride: 102 mmol/L (ref 96–106)
Creatinine, Ser: 1.57 mg/dL — ABNORMAL HIGH (ref 0.76–1.27)
Globulin, Total: 2.3 g/dL (ref 1.5–4.5)
Glucose: 88 mg/dL (ref 70–99)
Potassium: 4.7 mmol/L (ref 3.5–5.2)
Sodium: 139 mmol/L (ref 134–144)
Total Protein: 6.5 g/dL (ref 6.0–8.5)
eGFR: 47 mL/min/{1.73_m2} — ABNORMAL LOW (ref >59)

## 2023-10-19 LAB — CBC WITH DIFFERENTIAL/PLATELET
Basophils Absolute: 0.1 10*3/uL (ref 0.0–0.2)
Basos: 1 %
EOS (ABSOLUTE): 0.1 10*3/uL (ref 0.0–0.4)
Eos: 1 %
Hematocrit: 44.4 % (ref 37.5–51.0)
Hemoglobin: 14.6 g/dL (ref 13.0–17.7)
Immature Grans (Abs): 0 10*3/uL (ref 0.0–0.1)
Immature Granulocytes: 0 %
Lymphocytes Absolute: 3 10*3/uL (ref 0.7–3.1)
Lymphs: 27 %
MCH: 31.3 pg (ref 26.6–33.0)
MCHC: 32.9 g/dL (ref 31.5–35.7)
MCV: 95 fL (ref 79–97)
Monocytes Absolute: 0.9 10*3/uL (ref 0.1–0.9)
Monocytes: 8 %
Neutrophils Absolute: 7.1 10*3/uL — ABNORMAL HIGH (ref 1.4–7.0)
Neutrophils: 63 %
Platelets: 307 10*3/uL (ref 150–450)
RBC: 4.66 x10E6/uL (ref 4.14–5.80)
RDW: 12.2 % (ref 11.6–15.4)
WBC: 11.1 10*3/uL — ABNORMAL HIGH (ref 3.4–10.8)

## 2023-10-19 LAB — LIPID PANEL
Chol/HDL Ratio: 3.5 ratio (ref 0.0–5.0)
Cholesterol, Total: 197 mg/dL (ref 100–199)
HDL: 56 mg/dL (ref >39)
LDL Chol Calc (NIH): 124 mg/dL — ABNORMAL HIGH (ref 0–99)
Triglycerides: 93 mg/dL (ref 0–149)
VLDL Cholesterol Cal: 17 mg/dL (ref 5–40)

## 2023-10-19 LAB — HGB A1C W/O EAG: Hgb A1c MFr Bld: 6 % — ABNORMAL HIGH (ref 4.8–5.6)

## 2023-10-19 LAB — PSA: Prostate Specific Ag, Serum: 0.7 ng/mL (ref 0.0–4.0)

## 2024-01-12 DIAGNOSIS — C44619 Basal cell carcinoma of skin of left upper limb, including shoulder: Secondary | ICD-10-CM | POA: Diagnosis not present

## 2024-01-12 DIAGNOSIS — D485 Neoplasm of uncertain behavior of skin: Secondary | ICD-10-CM | POA: Diagnosis not present

## 2024-01-12 DIAGNOSIS — L570 Actinic keratosis: Secondary | ICD-10-CM | POA: Diagnosis not present

## 2024-01-12 DIAGNOSIS — D2261 Melanocytic nevi of right upper limb, including shoulder: Secondary | ICD-10-CM | POA: Diagnosis not present

## 2024-01-12 DIAGNOSIS — L821 Other seborrheic keratosis: Secondary | ICD-10-CM | POA: Diagnosis not present

## 2024-01-12 DIAGNOSIS — D2271 Melanocytic nevi of right lower limb, including hip: Secondary | ICD-10-CM | POA: Diagnosis not present

## 2024-01-12 DIAGNOSIS — D2262 Melanocytic nevi of left upper limb, including shoulder: Secondary | ICD-10-CM | POA: Diagnosis not present

## 2024-01-12 DIAGNOSIS — D2272 Melanocytic nevi of left lower limb, including hip: Secondary | ICD-10-CM | POA: Diagnosis not present

## 2024-01-12 DIAGNOSIS — D225 Melanocytic nevi of trunk: Secondary | ICD-10-CM | POA: Diagnosis not present

## 2024-01-12 DIAGNOSIS — Z85828 Personal history of other malignant neoplasm of skin: Secondary | ICD-10-CM | POA: Diagnosis not present

## 2024-01-12 DIAGNOSIS — Z08 Encounter for follow-up examination after completed treatment for malignant neoplasm: Secondary | ICD-10-CM | POA: Diagnosis not present

## 2024-01-12 DIAGNOSIS — D0462 Carcinoma in situ of skin of left upper limb, including shoulder: Secondary | ICD-10-CM | POA: Diagnosis not present

## 2024-01-25 DIAGNOSIS — C44619 Basal cell carcinoma of skin of left upper limb, including shoulder: Secondary | ICD-10-CM | POA: Diagnosis not present

## 2024-02-21 ENCOUNTER — Ambulatory Visit (INDEPENDENT_AMBULATORY_CARE_PROVIDER_SITE_OTHER): Payer: Medicare Other | Admitting: Nurse Practitioner

## 2024-02-21 ENCOUNTER — Encounter: Payer: Self-pay | Admitting: Nurse Practitioner

## 2024-02-21 VITALS — BP 135/80 | HR 68 | Temp 98.5°F | Resp 16 | Ht 68.0 in | Wt 167.4 lb

## 2024-02-21 DIAGNOSIS — I10 Essential (primary) hypertension: Secondary | ICD-10-CM

## 2024-02-21 DIAGNOSIS — R7303 Prediabetes: Secondary | ICD-10-CM | POA: Diagnosis not present

## 2024-02-21 DIAGNOSIS — K219 Gastro-esophageal reflux disease without esophagitis: Secondary | ICD-10-CM | POA: Diagnosis not present

## 2024-02-21 MED ORDER — PANTOPRAZOLE SODIUM 40 MG PO TBEC: 40.0000 mg | DELAYED_RELEASE_TABLET | Freq: Every day | ORAL | 3 refills | Status: AC

## 2024-02-21 NOTE — Progress Notes (Signed)
 [facility] [address] [place], [place] [address]  Internal MEDICINE  Office Visit Note  Patient Name: Adam Hurst  [id number]  [id number]  Date of Service: 02/21/2024  Chief Complaint  Patient presents with   Gastroesophageal Reflux   Hypertension   Follow-up    HPI Adam Hurst presents for a follow-up visit for hypertension, GERD, and prediabetes.  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  Prediabetes    Current Medication: Outpatient Encounter Medications as of 02/21/2024  Medication Sig   acetaminophen  (TYLENOL ) 500 MG tablet Take 500 mg by mouth every 6 (six) hours as needed for moderate pain or headache.   amLODipine  (NORVASC ) 10 MG tablet Take 1 tablet (10 mg total) by mouth daily.   aspirin EC 81 MG tablet Take 81 mg by mouth daily.   pantoprazole  (PROTONIX ) 40 MG tablet Take 1 tablet (40 mg total) by mouth daily.   [DISCONTINUED] pantoprazole  (PROTONIX ) 40 MG tablet Take 1 tablet (40 mg total) by mouth daily.   No facility-administered encounter medications on file as of 02/21/2024.    Surgical History: Past Surgical History:  Procedure Laterality Date   COLONOSCOPY W/ POLYPECTOMY     VASECTOMY Bilateral 1991   XI ROBOTIC ASSISTED VENTRAL HERNIA N/A 03/06/2020   Procedure: XI ROBOTIC ASSISTED VENTRAL HERNIA;  Surgeon: Rodolph Romano, MD;  Location: [facility] ORS;  Service: General;  Laterality: N/A;    Medical History: Past Medical History:  Diagnosis Date   GERD (gastroesophageal reflux disease)    Hypertension     Family History: Family History  Problem Relation Age of Onset   Alzheimer's disease Mother    Cancer Father     Social History   Socioeconomic History   Marital status: Married    Spouse name: Not on file   Number of children: Not on file   Years of education: Not on file   Highest education level: Not on  file  Occupational History   Not on file  Tobacco Use   Smoking status: Former    Current packs/day: 0.00    Types: Cigarettes    Quit date: 12/12/2020    Years since quitting: 3.1   Smokeless tobacco: Never  Vaping Use   Vaping status: Never Used  Substance and Sexual Activity   Alcohol use: Yes    Comment: rare   Drug use: Never   Sexual activity: Not on file  Other Topics Concern   Not on file  Social History Narrative   Not on file   Social Drivers of Health   Financial Resource Strain: Not on file  Food Insecurity: Not on file  Transportation Needs: Not on file  Physical Activity: Not on file  Stress: Not on file  Social Connections: Not on file  Intimate Partner Violence: Not on file      Review of Systems  Constitutional:  Negative for chills, fatigue and unexpected weight change.  HENT:  Negative for congestion, rhinorrhea, sneezing and sore throat.   Eyes:  Negative for redness.  Respiratory:  Negative for cough, chest tightness and shortness of breath.   Cardiovascular:  Negative for chest pain and palpitations.  Gastrointestinal:  Negative for abdominal pain, constipation, diarrhea, nausea and vomiting.  Genitourinary:  Negative for dysuria and frequency.  Musculoskeletal:  Negative for arthralgias, back pain, joint swelling and neck pain.  Skin:  Negative for rash.  Neurological: Negative.  Negative for tremors and numbness.  Hematological:  Negative for adenopathy. Does not bruise/bleed easily.  Psychiatric/Behavioral:  Negative for behavioral problems (Depression), sleep disturbance and suicidal ideas. The patient is not nervous/anxious.     Vital Signs: BP 135/80   Pulse 68   Temp 98.5 F (36.9 C)   Resp 16   Ht 5' 8 (1.727 m)   Wt 167 lb 6.4 oz (75.9 kg)   SpO2 98%   BMI 25.45 kg/m    Physical Exam Vitals reviewed.  Constitutional:      General: He is not in acute distress.    Appearance: Normal appearance. He is normal weight. He is  not ill-appearing.  HENT:     Head: Normocephalic and atraumatic.  Eyes:     Pupils: Pupils are equal, round, and reactive to light.  Cardiovascular:     Rate and Rhythm: Normal rate and regular rhythm.  Pulmonary:     Effort: Pulmonary effort is normal. No respiratory distress.  Neurological:     Mental Status: He is alert and oriented to person, place, and time.  Psychiatric:        Mood and Affect: Mood normal.        Behavior: Behavior normal.        Assessment/Plan: 1. Essential hypertension (Primary) Stable, continue amlodipine  as prescribed.   2. Prediabetes Stable, no issues.   3. Gastroesophageal reflux disease without esophagitis Continue pantoprazole  as prescribed.  - pantoprazole  (PROTONIX ) 40 MG tablet; Take 1 tablet (40 mg total) by mouth daily.  Dispense: 90 tablet; Refill: 3   General Counseling: Adam Hurst verbalizes understanding of the findings of todays visit and agrees with plan of treatment. I have discussed any further diagnostic evaluation that may be needed or ordered today. We also reviewed his medications today. he has been encouraged to call the office with any questions or concerns that should arise related to todays visit.    No orders of the defined types were placed in this encounter.   Meds ordered this encounter  Medications   pantoprazole  (PROTONIX ) 40 MG tablet    Sig: Take 1 tablet (40 mg total) by mouth daily.    Dispense:  90 tablet    Refill:  3    For future refills, keep on file. He already has a refill ready to pick up now, this is refills so he does not run out.    Return for previously scheduled, AWV, Seleta Hovland PCP in january .   Total time spent:30 Minutes Time spent includes review of chart, medications, test results, and follow up plan with the patient.   [place] Controlled Substance Database was reviewed by me.  This patient was seen by Mardy Maxin, FNP-C in collaboration with Dr. Sigrid Bathe as a part of collaborative  care agreement.   Gianah Batt R. Maxin, MSN, FNP-C Internal medicine

## 2024-02-22 ENCOUNTER — Encounter: Payer: Self-pay | Admitting: Nurse Practitioner

## 2024-03-07 DIAGNOSIS — C44619 Basal cell carcinoma of skin of left upper limb, including shoulder: Secondary | ICD-10-CM | POA: Diagnosis not present

## 2024-03-07 DIAGNOSIS — D0462 Carcinoma in situ of skin of left upper limb, including shoulder: Secondary | ICD-10-CM | POA: Diagnosis not present

## 2024-08-23 ENCOUNTER — Ambulatory Visit: Payer: Medicare Other | Admitting: Nurse Practitioner

## 2024-08-27 ENCOUNTER — Ambulatory Visit: Admitting: Nurse Practitioner

## 2024-08-27 ENCOUNTER — Encounter: Payer: Self-pay | Admitting: Nurse Practitioner

## 2024-08-27 VITALS — BP 138/85 | HR 65 | Temp 97.7°F | Resp 16 | Ht 68.0 in | Wt 171.2 lb

## 2024-08-27 DIAGNOSIS — K219 Gastro-esophageal reflux disease without esophagitis: Secondary | ICD-10-CM | POA: Diagnosis not present

## 2024-08-27 DIAGNOSIS — N1831 Chronic kidney disease, stage 3a: Secondary | ICD-10-CM | POA: Diagnosis not present

## 2024-08-27 DIAGNOSIS — Z0001 Encounter for general adult medical examination with abnormal findings: Secondary | ICD-10-CM

## 2024-08-27 DIAGNOSIS — D72829 Elevated white blood cell count, unspecified: Secondary | ICD-10-CM | POA: Diagnosis not present

## 2024-08-27 DIAGNOSIS — R7303 Prediabetes: Secondary | ICD-10-CM | POA: Insufficient documentation

## 2024-08-27 DIAGNOSIS — R17 Unspecified jaundice: Secondary | ICD-10-CM

## 2024-08-27 DIAGNOSIS — I10 Essential (primary) hypertension: Secondary | ICD-10-CM | POA: Diagnosis not present

## 2024-08-27 MED ORDER — AMLODIPINE BESYLATE 10 MG PO TABS: 10.0000 mg | ORAL_TABLET | Freq: Every day | ORAL | 3 refills | Status: AC

## 2024-08-27 NOTE — Progress Notes (Signed)
 [facility] [address] [place], [place] [address]  Internal MEDICINE  Office Visit Note  Patient Name: Adam Hurst  [id number]  [id number]  Date of Service: 08/27/2024  Chief Complaint  Patient presents with   Gastroesophageal Reflux   Hypertension   Medicare Wellness    HPI Adam Hurst presents for a medicare annual wellness visit.  Well-appearing 74 y.o. male with hypertension and GERD. His only medications are baby aspirin, amlodipine  and pantoprazole .  Routine CRC screening: cologuard done in 2024, due in 2027.  Eye exam: goes to [facility] regularly.  foot exam: done Labs: discussed routine lab results with patient and wife.  A1c is stable at 6.0, prediabetic.  Decreased kidney function -- creatinine 1.57, eGFR 47 Slightly elevated bilirubin 1.4 LDL is slightly elevated 124 PSA is normal. New or worsening pain: none  Other concerns: none      08/27/2024    9:41 AM 08/23/2023    9:06 AM 08/17/2022    9:15 AM  MMSE - Mini Mental State Exam  Orientation to time 5 5 5   Orientation to Place 5 5 5   Registration 3 3 3   Attention/ Calculation 5 5 5   Recall 3 3 3   Language- name 2 objects 2 2 2   Language- repeat 1 1 1   Language- follow 3 step command 3 3 3   Language- read & follow direction 1 1 1   Write a sentence 1 1 1   Copy design 1 1 1   Total score 30 30 30     Functional Status Survey: Is the patient deaf or have difficulty hearing?: Yes Does the patient have difficulty seeing, even when wearing glasses/contacts?: No Does the patient have difficulty concentrating, remembering, or making decisions?: No Does the patient have difficulty walking or climbing stairs?: No Does the patient have difficulty dressing or bathing?: No Does the patient have difficulty doing errands alone such as visiting a doctor's office or shopping?: No     01/04/2022    9:28 AM 08/17/2022    9:14 AM 02/15/2023    8:44 AM 08/23/2023    9:05 AM 08/27/2024    9:40 AM   Fall Risk  Falls in the past year? 0 0 0 0 0  Was there an injury with Fall?  0  0  0  0  Fall Risk Category Calculator  0 0 0 0  Fall Risk Category (Retired)  Low      (RETIRED) Patient Fall Risk Level Low fall risk  Low fall risk      Patient at Risk for Falls Due to No Fall Risks No Fall Risks No Fall Risks No Fall Risks   Fall risk Follow up Falls evaluation completed  Falls evaluation completed  Falls evaluation completed Falls evaluation completed Falls evaluation completed     Data saved with a previous flowsheet row definition       08/27/2024    9:40 AM  Depression screen PHQ 2/9  Decreased Interest 0  Down, Depressed, Hopeless 0  PHQ - 2 Score 0        Current Medication: Outpatient Encounter Medications as of 08/27/2024  Medication Sig   acetaminophen  (TYLENOL ) 500 MG tablet Take 500 mg by mouth every 6 (six) hours as needed for moderate pain or headache. (Patient not taking: Reported on 08/27/2024)   amLODipine  (NORVASC ) 10 MG tablet Take 1 tablet (10 mg total) by mouth daily.   aspirin EC 81 MG tablet Take 81 mg by mouth daily.  pantoprazole  (PROTONIX ) 40 MG tablet Take 1 tablet (40 mg total) by mouth daily.   [DISCONTINUED] amLODipine  (NORVASC ) 10 MG tablet Take 1 tablet (10 mg total) by mouth daily.   No facility-administered encounter medications on file as of 08/27/2024.    Surgical History: Past Surgical History:  Procedure Laterality Date   COLONOSCOPY W/ POLYPECTOMY     VASECTOMY Bilateral 1991   XI ROBOTIC ASSISTED VENTRAL HERNIA N/A 03/06/2020   Procedure: XI ROBOTIC ASSISTED VENTRAL HERNIA;  Surgeon: Rodolph Romano, MD;  Location: [facility] ORS;  Service: General;  Laterality: N/A;    Medical History: Past Medical History:  Diagnosis Date   GERD (gastroesophageal reflux disease)    Hypertension     Family History: Family History  Problem Relation Age of Onset   Alzheimer's disease Mother    Cancer Father     Social History    Socioeconomic History   Marital status: Married    Spouse name: Not on file   Number of children: Not on file   Years of education: Not on file   Highest education level: Not on file  Occupational History   Not on file  Tobacco Use   Smoking status: Every Day    Current packs/day: 0.00    Average packs/day: 1.0 packs/day    Types: Cigarettes    Last attempt to quit: 12/12/2020    Years since quitting: 3.7   Smokeless tobacco: Never  Vaping Use   Vaping status: Never Used  Substance and Sexual Activity   Alcohol use: Yes    Comment: rare   Drug use: Never   Sexual activity: Not on file  Other Topics Concern   Not on file  Social History Narrative   Not on file   Social Drivers of Health   Tobacco Use: High Risk (08/27/2024)   Patient History    Smoking Tobacco Use: Every Day    Smokeless Tobacco Use: Never    Passive Exposure: Not on file  Financial Resource Strain: Not on file  Food Insecurity: Not on file  Transportation Needs: Not on file  Physical Activity: Not on file  Stress: Not on file  Social Connections: Not on file  Intimate Partner Violence: Not on file  Depression (PHQ2-9): Low Risk (08/27/2024)   Depression (PHQ2-9)    PHQ-2 Score: 0  Alcohol Screen: Low Risk (01/04/2022)   Alcohol Screen    Last Alcohol Screening Score (AUDIT): 1  Housing: Not on file  Utilities: Not on file  Health Literacy: Not on file      Review of Systems  Constitutional:  Negative for activity change, appetite change, chills, fatigue, fever and unexpected weight change.  HENT: Negative.  Negative for congestion, ear pain, rhinorrhea, sore throat and trouble swallowing.   Eyes: Negative.   Respiratory: Negative.  Negative for cough, chest tightness, shortness of breath and wheezing.   Cardiovascular: Negative.  Negative for chest pain.  Gastrointestinal: Negative.  Negative for abdominal pain, blood in stool, constipation, diarrhea, nausea and vomiting.  Endocrine:  Negative.   Genitourinary: Negative.  Negative for difficulty urinating, dysuria, frequency, hematuria and urgency.  Musculoskeletal: Negative.  Negative for arthralgias, back pain, joint swelling, myalgias and neck pain.  Skin: Negative.  Negative for rash and wound.  Allergic/Immunologic: Negative.  Negative for immunocompromised state.  Neurological: Negative.  Negative for dizziness, seizures, numbness and headaches.  Hematological: Negative.   Psychiatric/Behavioral: Negative.  Negative for behavioral problems, self-injury and suicidal ideas. The patient is not nervous/anxious.  Vital Signs: BP 138/85 Comment: 156/80  Pulse 65   Temp 97.7 F (36.5 C)   Resp 16   Ht 5' 8 (1.727 m)   Wt 171 lb 3.2 oz (77.7 kg)   SpO2 98%   BMI 26.03 kg/m    Physical Exam Vitals reviewed.  Constitutional:      General: He is awake. He is not in acute distress.    Appearance: Normal appearance. He is well-developed, well-groomed and normal weight. He is not ill-appearing or diaphoretic.  HENT:     Head: Normocephalic and atraumatic.     Right Ear: Tympanic membrane, ear canal and external ear normal.     Left Ear: Tympanic membrane, ear canal and external ear normal.     Nose: Nose normal. No congestion or rhinorrhea.     Mouth/Throat:     Mouth: Mucous membranes are moist.     Pharynx: Oropharynx is clear. No oropharyngeal exudate.  Eyes:     General: Lids are normal. Vision grossly intact. Gaze aligned appropriately. No scleral icterus.       Right eye: No discharge.        Left eye: No discharge.     Extraocular Movements: Extraocular movements intact.     Conjunctiva/sclera: Conjunctivae normal.     Pupils: Pupils are equal, round, and reactive to light.     Funduscopic exam:    Right eye: Red reflex present.        Left eye: Red reflex present. Neck:     Thyroid : No thyromegaly.     Vascular: No JVD.     Trachea: No tracheal deviation.  Cardiovascular:     Rate and Rhythm:  Normal rate and regular rhythm.     Pulses: Normal pulses.          Dorsalis pedis pulses are 2+ on the right side and 2+ on the left side.       Posterior tibial pulses are 2+ on the right side and 2+ on the left side.     Heart sounds: Normal heart sounds, S1 normal and S2 normal. No murmur heard.    No friction rub. No gallop.  Pulmonary:     Effort: Pulmonary effort is normal. No respiratory distress.     Breath sounds: Normal breath sounds and air entry. No stridor. No wheezing or rales.  Chest:     Chest wall: No tenderness.  Abdominal:     General: Bowel sounds are normal. There is no distension.     Palpations: Abdomen is soft. There is no shifting dullness, fluid wave, mass or pulsatile mass.     Tenderness: There is no abdominal tenderness. There is no guarding or rebound.  Musculoskeletal:        General: No tenderness or deformity. Normal range of motion.     Cervical back: Normal range of motion and neck supple.     Right lower leg: No edema.     Left lower leg: No edema.     Right foot: Normal range of motion. No deformity, bunion, Charcot foot, foot drop or prominent metatarsal heads.     Left foot: Normal range of motion. No deformity, bunion, Charcot foot, foot drop or prominent metatarsal heads.  Feet:     Right foot:     Protective Sensation: 6 sites tested.  6 sites sensed.     Skin integrity: Dry skin present. No ulcer, blister, skin breakdown, erythema, warmth, callus or fissure.     Toenail  Condition: Right toenails are abnormally thick.     Left foot:     Protective Sensation: 6 sites tested.  6 sites sensed.     Skin integrity: Dry skin present. No ulcer, blister, skin breakdown, erythema, warmth, callus or fissure.     Toenail Condition: Left toenails are abnormally thick.  Lymphadenopathy:     Cervical: No cervical adenopathy.  Skin:    General: Skin is warm and dry.     Capillary Refill: Capillary refill takes less than 2 seconds.     Coloration: Skin  is not pale.     Findings: No erythema or rash.  Neurological:     Mental Status: He is alert and oriented to person, place, and time.     Cranial Nerves: No cranial nerve deficit.     Motor: No abnormal muscle tone.     Coordination: Coordination normal.     Deep Tendon Reflexes: Reflexes are normal and symmetric.  Psychiatric:        Mood and Affect: Mood and affect normal.        Behavior: Behavior normal. Behavior is cooperative.        Thought Content: Thought content normal.        Judgment: Judgment normal.        Assessment/Plan: 1. Encounter for Medicare annual examination with abnormal findings (Primary) Age-appropriate preventive screenings and vaccinations discussed. Routine labs for health maintenance results discussed with patient and his wife today. PHM updated.    2. Essential hypertension Stable, continue amlodipine  as prescribed.  - amLODipine  (NORVASC ) 10 MG tablet; Take 1 tablet (10 mg total) by mouth daily.  Dispense: 90 tablet; Refill: 3  3. Stage 3a chronic kidney disease (HCC) Repeat lab ordered  - Renal Function Panel  4. Prediabetes A1c is 6.0 which is stable.   5. Elevated bilirubin Repeat lab ordered  - Hepatic function panel  6. Leukocytosis, unspecified type Routine lab ordered  - CBC with Differential/Platelet  7. Gastroesophageal reflux disease without esophagitis Continue pantoprazole  as prescribed      General Counseling: Adam Hurst verbalizes understanding of the findings of todays visit and agrees with plan of treatment. I have discussed any further diagnostic evaluation that may be needed or ordered today. We also reviewed his medications today. he has been encouraged to call the office with any questions or concerns that should arise related to todays visit.    Orders Placed This Encounter  Procedures   CBC with Differential/Platelet   Renal Function Panel   Hepatic function panel    Meds ordered this encounter  Medications    amLODipine  (NORVASC ) 10 MG tablet    Sig: Take 1 tablet (10 mg total) by mouth daily.    Dispense:  90 tablet    Refill:  3    For future refills, keep on file    Return in about 6 months (around 02/24/2025) for F/U, Labs, Adam Hurst PCP.   Total time spent:30 Minutes Time spent includes review of chart, medications, test results, and follow up plan with the patient.   [place] Controlled Substance Database was reviewed by me.  This patient was seen by Mardy Maxin, FNP-C in collaboration with Dr. Sigrid Bathe as a part of collaborative care agreement.  Sergi Gellner R. Maxin, MSN, FNP-C Internal medicine

## 2025-02-25 ENCOUNTER — Ambulatory Visit: Admitting: Nurse Practitioner

## 2025-08-28 ENCOUNTER — Ambulatory Visit: Admitting: Nurse Practitioner
